# Patient Record
Sex: Female | Born: 1960 | Race: White | Hispanic: No | Marital: Married | Smoking: Current every day smoker
Health system: Southern US, Community
[De-identification: ages and names within clinical notes are randomized; demographics above are authoritative.]

## PROBLEM LIST (undated history)

## (undated) DIAGNOSIS — R079 Chest pain, unspecified: Secondary | ICD-10-CM

## (undated) DIAGNOSIS — I10 Essential (primary) hypertension: Secondary | ICD-10-CM

## (undated) DIAGNOSIS — F429 Obsessive-compulsive disorder, unspecified: Secondary | ICD-10-CM

## (undated) DIAGNOSIS — M810 Age-related osteoporosis without current pathological fracture: Secondary | ICD-10-CM

## (undated) DIAGNOSIS — E7800 Pure hypercholesterolemia, unspecified: Secondary | ICD-10-CM

## (undated) DIAGNOSIS — F411 Generalized anxiety disorder: Secondary | ICD-10-CM

## (undated) DIAGNOSIS — K219 Gastro-esophageal reflux disease without esophagitis: Secondary | ICD-10-CM

## (undated) HISTORY — DX: Gastro-esophageal reflux disease without esophagitis: K21.9

## (undated) HISTORY — DX: Generalized anxiety disorder: F41.1

## (undated) HISTORY — DX: Chest pain, unspecified: R07.9

## (undated) HISTORY — PX: OOPHORECTOMY: SHX86

## (undated) HISTORY — PX: TUBAL LIGATION: SHX77

## (undated) HISTORY — PX: CHOLECYSTECTOMY: SHX55

---

## 2002-10-13 ENCOUNTER — Inpatient Hospital Stay (HOSPITAL_COMMUNITY): Admission: EM | Admit: 2002-10-13 | Discharge: 2002-10-15 | Payer: Self-pay | Admitting: Emergency Medicine

## 2002-10-13 ENCOUNTER — Encounter: Payer: Self-pay | Admitting: Emergency Medicine

## 2002-10-14 ENCOUNTER — Encounter: Payer: Self-pay | Admitting: Family Medicine

## 2002-11-24 ENCOUNTER — Inpatient Hospital Stay (HOSPITAL_COMMUNITY): Admission: RE | Admit: 2002-11-24 | Discharge: 2002-11-27 | Payer: Self-pay | Admitting: General Surgery

## 2004-07-03 ENCOUNTER — Ambulatory Visit (HOSPITAL_COMMUNITY): Admission: RE | Admit: 2004-07-03 | Discharge: 2004-07-03 | Payer: Self-pay | Admitting: Internal Medicine

## 2004-07-05 ENCOUNTER — Ambulatory Visit (HOSPITAL_COMMUNITY): Admission: RE | Admit: 2004-07-05 | Discharge: 2004-07-05 | Payer: Self-pay | Admitting: Internal Medicine

## 2004-07-25 ENCOUNTER — Observation Stay (HOSPITAL_COMMUNITY): Admission: RE | Admit: 2004-07-25 | Discharge: 2004-07-26 | Payer: Self-pay | Admitting: General Surgery

## 2004-09-22 ENCOUNTER — Ambulatory Visit (HOSPITAL_COMMUNITY): Admission: RE | Admit: 2004-09-22 | Discharge: 2004-09-22 | Payer: Self-pay | Admitting: Internal Medicine

## 2005-08-15 ENCOUNTER — Emergency Department (HOSPITAL_COMMUNITY): Admission: EM | Admit: 2005-08-15 | Discharge: 2005-08-15 | Payer: Self-pay | Admitting: Emergency Medicine

## 2005-09-24 ENCOUNTER — Ambulatory Visit: Payer: Self-pay | Admitting: Gastroenterology

## 2005-10-23 ENCOUNTER — Ambulatory Visit: Payer: Self-pay | Admitting: Gastroenterology

## 2006-01-02 ENCOUNTER — Ambulatory Visit (HOSPITAL_COMMUNITY): Admission: RE | Admit: 2006-01-02 | Discharge: 2006-01-02 | Payer: Self-pay | Admitting: Family Medicine

## 2006-01-30 ENCOUNTER — Ambulatory Visit (HOSPITAL_COMMUNITY): Admission: RE | Admit: 2006-01-30 | Discharge: 2006-01-30 | Payer: Self-pay | Admitting: Gastroenterology

## 2006-01-30 ENCOUNTER — Encounter (INDEPENDENT_AMBULATORY_CARE_PROVIDER_SITE_OTHER): Payer: Self-pay | Admitting: Specialist

## 2007-11-05 ENCOUNTER — Ambulatory Visit: Payer: Self-pay | Admitting: Orthopedic Surgery

## 2007-11-05 DIAGNOSIS — M67919 Unspecified disorder of synovium and tendon, unspecified shoulder: Secondary | ICD-10-CM | POA: Insufficient documentation

## 2007-11-05 DIAGNOSIS — M719 Bursopathy, unspecified: Secondary | ICD-10-CM

## 2010-01-18 ENCOUNTER — Ambulatory Visit (HOSPITAL_COMMUNITY): Admission: RE | Admit: 2010-01-18 | Discharge: 2010-01-18 | Payer: Self-pay | Admitting: Family Medicine

## 2010-08-11 NOTE — Op Note (Signed)
[name], [name]              ACCOUNT NO.:  [id number]   MEDICAL RECORD NO.:  [id number]          PATIENT TYPE:  AMB   LOCATION:  DAY                           FACILITY:  [facility]   PHYSICIAN:  Jerolyn Shin C. Katrinka Blazing, M.D.   DATE OF BIRTH:  [birth year]   DATE OF PROCEDURE:  07/25/2004  DATE OF DISCHARGE:                                 OPERATIVE REPORT   PREOPERATIVE DIAGNOSIS:  Chronic cholecystitis.   POSTOPERATIVE DIAGNOSIS:  Chronic cholecystitis.   OPERATION/PROCEDURE:  Laparoscopic cholecystectomy.   SURGEON:  Dirk Dress. Katrinka Blazing, M.D.   DESCRIPTION OF PROCEDURE:  Under general anesthesia the patient's abdomen  was prepped and draped into a sterile field.  A limited midline  supraumbilical incision was made.  A Veress needle was inserted and the  abdomen was insufflated with 2 L of CO2.  Using a Visiport guide, a 10 mm  port was placed.  Laparoscope was placed and gallbladder was visualized.  The patient was positioned in the reverse Trendelenburg.  Under videoscopic  guidance, a 10 mm port and two 5 mm ports were placed in the right subcostal  region.  upper quadrant.  The gallbladder was grasped and positioned.  Cystic duct was dissected from the gallbladder and back to the common bile  duct.  The cystic duct was clipped with five clips close to the gallbladder.  The clips totally traversed the fold of the duct.  The duct was divided.  Next cystic artery had two branches.  Each branch was clipped with three  clips and divided.  The gallbladder was separated from the intrahepatic  space with electrocautery.  It was passed and retrieved intact uneventfully.  Hemostasis in the bed was achieved without difficulty.  The patient  tolerated the procedure well.  Irrigation was carried out and fluid returned  clear.  The patient tolerated the procedure well.  CO2 was  allowed to  escape from the abdomen and the ports were removed.  The incisions were  closed using 0 Dexon with two larger  incisions with staples under the skin.  The patient tolerated the procedure  well.  Dressings were placed.  She was awakened from anesthesia  uneventfully, transferred to a bed and taken to the post anesthesia care  unit for monitoring.      LCS/MEDQ  D:  07/25/2004  T:  07/25/2004  Job:  [id number]   cc:   Madelin Rear. Sherwood Gambler, MD  [address]  [place]  [place] [address]  Fax: [phone]
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
## 2010-08-11 NOTE — Op Note (Signed)
   NAME:  [name], [name]                        ACCOUNT NO.:  [id number]   MEDICAL RECORD NO.:  [id number]                   PATIENT TYPE:  INP   LOCATION:  A312                                 FACILITY:  [facility]   PHYSICIAN:  Dirk Dress. Katrinka Blazing, M.D.                DATE OF BIRTH:  03-14-1961   DATE OF PROCEDURE:  11/24/2002  DATE OF DISCHARGE:                                 OPERATIVE REPORT   PREOPERATIVE DIAGNOSIS:  Right pelvic mass.   POSTOPERATIVE DIAGNOSIS:  Dermoid tumor and mucinous cystadenoma, right  ovary.   PROCEDURE:  Right oophorectomy.   SURGEON:  Dirk Dress. Katrinka Blazing, M.D.   DESCRIPTION OF PROCEDURE:  Under general anesthesia, the patient's abdomen  was prepped and draped in a sterile field.  A Pfannenstiel incision was  made.  Upon entering the abdomen, the left ovary was very small,  foreshrunken, with normal tube.  The uterus was slightly nodular but very  small.  It was a large mass of the right ovary with solid and cystic  components.  The right ovary was dissected.  The vascular pedicle was  dissected.  The pedicle was doubly clamped and divided.  The ovary was then  separated from the tube between two [facility] clamps.  It was sent for  pathology.  The vessels were controlled with double ligatures of 0 Dexon.  Hemostasis was achieved.  Inspection of the upper abdomen was unremarkable.  Frozen section diagnosis took about 20 minutes.  Frozen section diagnosis  was dermoid tumor and mucinous cystadenoma, both benign.  The patient  tolerated the procedure well.  The sponge, needle, instrument, and blade  counts were verified as correct.  The peritoneum and fascia were closed with  0 Biosyn.  The fascia was closed with 0 Prolene.  Subcutaneous tissue closed  with 3-0 Biosyn.  Skin was closed with staples.  The patient tolerated the  procedure well.  Closing sponge count was correct.  A sterile dressing was  placed.  She was awakened from anesthesia uneventfully, transferred  to a  bed, and taken to the postanesthetic care unit for monitoring.                                               Dirk Dress. Katrinka Blazing, M.D.    LCS/MEDQ  D:  11/24/2002  T:  11/24/2002  Job:  [id number]

## 2010-08-11 NOTE — Op Note (Signed)
NAME:  [name], [name]              ACCOUNT NO.:  [id number]   MEDICAL RECORD NO.:  [id number]          PATIENT TYPE:  AMB   LOCATION:  ENDO                         FACILITY:  [facility]   PHYSICIAN:  Anselmo Rod, M.D.  DATE OF BIRTH:  Jun 30, 1960   DATE OF PROCEDURE:  01/30/2006  DATE OF DISCHARGE:                                 OPERATIVE REPORT   PROCEDURE PERFORMED:  Esophagogastroduodenoscopy with multiple cold  biopsies.   ENDOSCOPIST:  Anselmo Rod, M.D.   INSTRUMENT USED:  Olympus video panendoscope.   INDICATIONS FOR PROCEDURE:  The patient is a 50 year old white female with a  history of abnormal weight loss and change in bowel habits undergoing an  esophagogastroduodenoscopy.  Rule out peptic ulcer disease, esophagitis,  etc.  Biopsies from the small bowel obtained to rule out sprue.   PREPROCEDURE PREPARATION:  Informed consent was procured from the patient.  The patient was fasted for four hours prior to the procedure.  The risks and  benefits of the procedure were discussed with her in great detail.   PREPROCEDURE PHYSICAL:  The patient had stable vital signs.  Neck supple,  chest clear to auscultation.  S1, S2 regular.  Abdomen soft with normal  bowel sounds.   DESCRIPTION OF PROCEDURE:  The patient was placed in the left lateral  decubitus position and sedated with 100 mcg of fentanyl and 10 mg of Versed  in slow incremental doses.  Once the patient was adequately sedated and  maintained on low-flow oxygen and continuous cardiac monitoring, the Olympus  video panendoscope was advanced through the mouth piece over the tongue into  the esophagus under direct vision.  The entire esophagus was widely patent  with no evidence of ring, stricture, masses, esophagitis or Barrett's  mucosa.  The scope was then advanced to the stomach.  A small hiatal hernia  was seen on high retroflexion.  Severe diffuse gastritis was noted.  Antral  biopsies were done to rule out  presence of Helicobacter pylori by pathology.  The proximal small bowel appeared normal.  Small bowel biopsies were done to  rule out sprue.  No ulcers, erosions, masses or polyps were identified.  There was no outlet obstruction.  The patient tolerated the procedure well  without immediate complications.   IMPRESSION:  1. Normal appearing esophagus with no evidence of ring, stricture, masses,      esophagus, or Barrett's mucosa.  2. Small hiatal hernia.  3. Diffuse gastritis with no evidence of ulcers, erosions, masses or      polyps.  Antral biopsies done for Helicobacter pylori.'  4. Normal proximal small bowel.  Small bowel biopsies done to rule out      sprue.   RECOMMENDATIONS:  1. Await pathology results.  2. Avoid all nonsteroidals for now.  3. Proceed with a colonoscopy at this time.  Further recommendations will      be made thereafter.      Anselmo Rod, M.D.  Electronically Signed     JNM/MEDQ  D:  01/30/2006  T:  01/31/2006  Job:  [id number]   cc:  Renaye Rakers, M.D.
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
## 2010-08-11 NOTE — H&P (Signed)
[name], [name]              ACCOUNT NO.:  [id number]   MEDICAL RECORD NO.:  [id number]          PATIENT TYPE:  AMB   LOCATION:  DAY                           FACILITY:  [facility]   PHYSICIAN:  Jerolyn Shin C. Katrinka Blazing, M.D.   DATE OF BIRTH:  May 29, 1960   DATE OF ADMISSION:  DATE OF DISCHARGE:  LH                                HISTORY & PHYSICAL   HISTORY OF PRESENT ILLNESS:  A 50 year old female with a one-month history  of epigastric fullness, nausea, poor appetite, and weight loss.  She has  lost weight from 112 pounds to 94 pounds over the past 3 months.  Upper  abdominal ultrasound was unremarkable, but HIDA scan shows no function of  the gallbladder.  The patient is felt to have acalculous cholecystitis, and  will proceed with laparoscopic cholecystectomy.   PAST HISTORY:  1.  She has diagnosed obsessive compulsive disorder.  2.  Gastroesophageal reflux disease.  3.  Chronic low back pain.   MEDICATIONS:  1.  Aciphex 20 mg daily.  2.  Luvox 50 mg daily.  3.  Wellbutrin SR 100 mg daily.  4.  Diazepam 10 mg daily.   ALLERGIES:  SULFA.   SURGERY:  Tubal ligation and right oophorectomy.   PHYSICAL EXAMINATION:  VITAL SIGNS:  Blood pressure 124/82, pulse 72,  respirations 18, weight 94 pounds.  HEENT:  Unremarkable.  NECK:  Supple.  No JVD, bruit, adenopathy, or thyromegaly.  CHEST:  Clear to auscultation.  HEART:  Regular rate and rhythm without murmur, gallop, or rub.  ABDOMEN:  Moderate epigastric and right upper quadrant tenderness.  EXTREMITIES:  No clubbing, cyanosis, or edema.  NEUROLOGIC:  No focal motor, sensory, or cerebellar deficit.   IMPRESSION:  1.  Chronic acalculous cholecystitis.  2.  Gastroesophageal reflux disease.  3.  Chronic low back pain.  4.  Obsessive compulsive disorder.   PLAN:  Laparoscopic cholecystectomy.      LCS/MEDQ  D:  07/24/2004  T:  07/25/2004  Job:  [id number]   cc:   [facility]   [facility]
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
## 2010-08-11 NOTE — Op Note (Signed)
NAME:  [name], [name]              ACCOUNT NO.:  [id number]   MEDICAL RECORD NO.:  [id number]          PATIENT TYPE:  AMB   LOCATION:  ENDO                         FACILITY:  [facility]   PHYSICIAN:  Anselmo Rod, M.D.  DATE OF BIRTH:  13-Apr-1960   DATE OF PROCEDURE:  01/30/2006  DATE OF DISCHARGE:                                 OPERATIVE REPORT   PROCEDURE PERFORMED:  Colonoscopy with multiple cold biopsies.   ENDOSCOPIST:  Anselmo Rod, M.D.   INSTRUMENT USED:  Pediatric adjustable colonoscope.   INDICATIONS FOR PROCEDURE:  A 50 year old white female with a history of  abnormal weight loss, family history of colon cancer, and change in bowel  habits undergoing a screening colonoscopy to rule out colonic polyps,  masses, etc.   PRE-PROCEDURE PREPARATION:  Informed consent was procured from the patient.  The patient fasted for four hours prior to the procedure and prepped with 20  OsmoPrep pills the night of and 12 OsmoPrep pills the morning prior to the  procedure.  Risks and benefits of the procedure, including a 10% missed rate  of cancer and polyps, were discussed with the patient as well.   PRE-PROCEDURE PHYSICAL EXAMINATION:  VITAL SIGNS:  Stable vital signs.  NECK:  Supple.  CHEST:  Clear to auscultation.  HEART:  S1 and S2.  Regular.  ABDOMEN:  Soft with normal bowel sounds.   DESCRIPTION OF PROCEDURE:  The patient was placed in the left lateral  decubitus position and sedated with an additional 25 mcg of fentanyl and 2.5  mg of Versed given intravenously in slow, incremental doses.  Once the  patient was adequately sedated, maintained on low flow oxygen and continuous  cardiac monitoring, the Olympus video colonoscope was advanced from the  rectum to the cecum.  Multiple washes were done.  A small sessile polyp was  biopsied from the rectosigmoid colon.  Random colon biopsies were done to  rule out collagenous colitis.  No masses, polyps, erosions, ulcerations,  or  diverticula were seen in the appendiceal orifice, and the ileocecal valve  was clearly visualized and photographed.  The terminal ileum appeared  healthy and without lesions.  Prominent internal hemorrhoids were seen on  retroflexion.  The patient tolerated the procedure well without immediate  complications.   IMPRESSION:  1. A small sessile polyp, biopsied from rectosigmoid colon.  2. Random colon biopsies done to rule out collagenous colitis.  3. Prominent internal hemorrhoids seen on retroflexion.  4. Normal terminal ileum.   RECOMMENDATIONS:  1. Await pathology results.  2. Avoid all nonsteroidals for now.  3. Outpatient followup in the next two weeks for further recommendations.      A repeat colonoscopy has been recommended in the next 10 years or      earlier if the patient has any abnormal symptoms requiring an earlier      check.      Anselmo Rod, M.D.  Electronically Signed     JNM/MEDQ  D:  01/30/2006  T:  01/31/2006  Job:  [id number]   cc:   Renaye Rakers, M.D.
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
## 2010-08-11 NOTE — Consult Note (Signed)
NAME:  PARTHENA, FERGESON              ACCOUNT NO.:  [id number]   MEDICAL RECORD NO.:  [id number]          PATIENT TYPE:  EMS   LOCATION:  ED                            FACILITY:  [facility]   PHYSICIAN:  Kassie Mends, M.D.      DATE OF BIRTH:  [birth year]   DATE OF CONSULTATION:  DATE OF DISCHARGE:  08/15/2005                                   CONSULTATION   Dear Dr. Sherwood Gambler:   I am seeing Ms. Brahm as a new patient consultation today.  She is seen at  your request for diarrhea, abdominal pain, nausea, and weight loss.   Ms. Grassi is a 50 year old female who weighed 106 pounds in August, 2004.  She was seen in May 2006 for laparoscopic cholecystectomy and weighed 94  pounds.  Prior to her cholecystectomy, she had difficulty with constipation.  Since her cholecystectomy, she has had intermittent change in her bowel  consistency.  She reports they are solid sometimes, other times liquid,  other times soft, and other times mixed.  She does complain of some upper  and lower abdominal pain 1-2 times per week.  The abdominal pain does not  keep her awake.  She says that her symptoms are better since she was last  seen by you.  She was having 6-8 bowel movements a day.  Currently, she is  having 2-3 bowel movements daily.  She denies any blood in her stool.  The  consistency of her stool is like cow manure to just mucus.  Bowel  movements can be triggered by eating, coughing, or smoking a cigarette.  No  particular foods make it worse.  She consumes very little cheese or ice  cream.  Apple juice does not cause her to have this problem.  She does  complain of a decreased appetite.  She complains of belching and burping  when she eats.  She denies any inability to flush her stool, although she  sometimes sees a film on the water.  She does drink well water.  She has no  sick contacts with similar symptoms.  She denies any fever.  She has been  tried on cholestyramine in the past, which has helped with  change in her  stool consistency.  She complains that the cholestyramine caused her to be  constipated, so she stopped it.  She may use it as needed when her number of  bowel movements increases.  Her bowel movement last night was normal and  formed but small in caliber.  This morning she had a soft stool.  She  reports having stool sent for O&P and taking a worm pill.  The medication  did not change her stool consistency.   Past medical history includes obsessive-compulsive disorder and depression.  She reports that she has been told that she has anorexia.  She has  osteoarthritis.  Her past surgical history includes ovarian tumor removal, tubal ligation,  and tonsillectomy.  She is allergic to SULFA.She reports taking Luvox,  diazepam, oxandrolone, cholestyramine, magnesium as needed, Phenergan as  needed, AcipHex as needed, and Tylox as needed.  She has a family history of  colon cancer in her aunt.  She has no first-degree relatives with colon  cancer or colon polyps. She is married.  She denies any significant alcohol  intake ever.  She does smoke a pack a day.  On review of systems, she weighed 125 pounds in high school.  Her review of  systems are as per the HPI, otherwise all other systems are negative.   PHYSICAL EXAMINATION:  VITAL SIGNS:  Weight 92.5 pounds.  Height 5 foot 2-  1/2 inches.  BMI 16.8 (underweight).  Temperature 97.7, blood pressure  100/68.  GENERAL:  She is no apparent distress, alert and oriented x4.  HEENT:  Atraumatic and normocephalic.  Pupils are equal and reactive to  light.  Mouth:  No oral lesions.  She has dentures.  Her posterior pharynx  is without erythema or exudate.  NECK:  Full range of motion and no  lymphadenopathy.  LUNGS:  Clear to auscultation bilaterally.  CARDIOVASCULAR:  Regular rhythm.  No murmur.  Normal S1 and S2.  ABDOMEN:  Bowel sounds are present, soft, nontender, nondistended.  No rebound or  guarding.  No hepatosplenomegaly.  She  has no focal neurological deficits.  EXTREMITIES:  Without clubbing, cyanosis or edema. She has no focal  neurologic deficits.   Ms. Nakatani is a 50 year old female with intermittent change in her stool  consistency.  Her body mass index is 16.8, and her weight has been stable  since May, 2006.  Thank you for allowing me to see Ms. Vandervelden in  consultation.  My list of recommendations follow.   RECOMMENDATIONS:  I recommend a TSH level today.  She should have a random  fecal fat and fecal WBCs.  I will obtain a Giardia antigen.  She should  start calcium carbonate two with meals to reduce bile-salt induced diarrhea.  She is asked to titrate to 1 with meals if she becomes constipated.  If her  bowel movements are increased to 6-8 per day, she is to use cholestyramine.  She is also asked to use Metamucil 1-2 times day to bulk up her stools and  help with the intermittent change in her stool consistency.  The calcium  carbonate can also be added due to her being a thin white female who smokes.  She will follow up in one month and I will assess if her weight remains  stable. If not, I recommend an upper endoscopy and/or a colonoscopy.   Please feel free to contact me at [phone] with additional questions.      Kassie Mends, M.D.  Electronically Signed     SM/MEDQ  D:  09/24/2005  T:  09/24/2005  Job:  [id number]   cc:   Madelin Rear. Sherwood Gambler, MD  Fax: [phone]

## 2010-08-11 NOTE — Consult Note (Signed)
[name]              ACCOUNT NO.:  [id number]   MEDICAL RECORD NO.:  [id number]          PATIENT TYPE:  AMB   LOCATION:  DAY                           FACILITY:  [facility]   PHYSICIAN:  Kassie Mends, M.D.      DATE OF BIRTH:  [birth year]   DATE OF CONSULTATION:  DATE OF DISCHARGE:                                   CONSULTATION   Madelin Rear. Sherwood Gambler, MD  [address]  [place]  [place] [address]   Dear Dr. Sherwood Gambler:   I am seeing [name] as a return patient visit on today.  She was last  seen on July 3 for diarrhea, abdominal pain, nausea, and weight loss.  Ms.  Moncrieffe has a significant psychological history of reportedly manic  depression, obsessive-compulsive disorder, and possibly anorexia nervosa.  She is seen regularly by her therapist, Scarlette Slice, and Dr. Duayne Cal.  I made  recommendations for her symptoms, but there was some confusion, and she was  unable to comply with my recommendations.  She is here today to discuss  managing her symptoms.  She did have a fecal fat which was normal, Giardia  and cryptosporidium negative, and fecal WBCs negative.  She was unable to  find the recommended calcium and fiber supplementation until recently.  She  took one calcium tablet and one FiberChoice tablet, and the following  morning had nausea and vomiting and did not want to restart those pills  until she actually spoke with me.   She has a myriad of complaints and continues to focus on the [facility]  discharge from her rectum.  Her bowel movements vary in their form from soft  to watery to sausage-type.  She denies any blood in her stool or fever.  She  denies being hungry.  She eats zero to one time a day.  Her labs and prior  workup were again reviewed.  Her CT scan in April of 2007 showed no acute  intra-abdominal pathology, hemoglobin 14.2, platelets 153, albumin 4.5, and  normal hepatic function panel.  She had a TSH checked in July of 2007 which  was 0.696.   MEDICATIONS:  1.  Luvox 50 mg, three daily.  2. Diazepam 10 mg every six hours as needed.  3. Cholestyramine as needed.  4. Tylox as needed.   OBJECTIVE:  VITAL SIGNS:  Weight 92.5 pounds (unchanged since last visit one  month ago).  Height 5 feet 2-1/2 inches.  Temperature 97.9, blood pressure  100/60, pulse 60.  GENERAL:  She is in no apparent distress, alert and  oriented times four.  NECK:  Full range of motion and no lymphadenopathy.  LUNGS:  Clear to  auscultation bilaterally.CARDIOVASCULAR:  A regular rhythm.  No  murmur.ABDOMEN:  Bowel sounds are present.  Soft, nontender, nondistended.  No rebound or guarding.   ASSESSMENT:  [name] is a 50 year old female who has multiple somatic  complaints.  Her abdominal pain, bloating, and diarrhea and weight loss are  of unclear etiology.  The differential diagnosis includes the physical  manifestation of psychological disturbance, functional diarrhea and  abdominal  pain, nonulcerative dyspepsia, and a low likelihood of celiac  sprue or microscopic colitis.  Again, she may have a component of post-  cholecystectomy diarrhea.  Thank you for allowing me to see [name] in  consultation.  My recommendations follow.   PLAN:  Obtain upper endoscopy within the next seven days to biopsy her  duodenum to rule out definitively celiac sprue.  She will also have a  flexible sigmoidoscopy with random colonic biopsies to evaluate for  microscopic, collagenous, or lymphocytic colitis.  She is asked to take  calcium with vitamin D, one with meals.  Her calcium tablets include 1.2  grams of calcium carbonate and 800 mg of vitamin D.  She is asked to begin  fiber and take one to two daily.  I will obtain the celiac panel from your  office.  She may add Levsin 0.125 mg one or two 30 minutes before meals and  may repeat that every four hours.  She is asked not to take more than 10 per  day.  She is given all of her instructions in written format.  She may  follow up  in one month.      Kassie Mends, M.D.  Electronically Signed     SM/MEDQ  D:  10/23/2005  T:  10/23/2005  Job:  [id number]   cc:   Madelin Rear. Sherwood Gambler, MD  Fax: [phone]

## 2015-05-31 DIAGNOSIS — F5002 Anorexia nervosa, binge eating/purging type: Secondary | ICD-10-CM | POA: Diagnosis not present

## 2015-05-31 DIAGNOSIS — F419 Anxiety disorder, unspecified: Secondary | ICD-10-CM | POA: Diagnosis not present

## 2015-08-30 DIAGNOSIS — Z681 Body mass index (BMI) 19 or less, adult: Secondary | ICD-10-CM | POA: Diagnosis not present

## 2015-08-30 DIAGNOSIS — F5000 Anorexia nervosa, unspecified: Secondary | ICD-10-CM | POA: Diagnosis not present

## 2015-08-30 DIAGNOSIS — F419 Anxiety disorder, unspecified: Secondary | ICD-10-CM | POA: Diagnosis not present

## 2015-08-30 DIAGNOSIS — E785 Hyperlipidemia, unspecified: Secondary | ICD-10-CM | POA: Diagnosis not present

## 2015-11-29 DIAGNOSIS — E785 Hyperlipidemia, unspecified: Secondary | ICD-10-CM | POA: Diagnosis not present

## 2015-11-29 DIAGNOSIS — F5000 Anorexia nervosa, unspecified: Secondary | ICD-10-CM | POA: Diagnosis not present

## 2015-11-29 DIAGNOSIS — F411 Generalized anxiety disorder: Secondary | ICD-10-CM | POA: Diagnosis not present

## 2015-11-29 DIAGNOSIS — K589 Irritable bowel syndrome without diarrhea: Secondary | ICD-10-CM | POA: Diagnosis not present

## 2016-02-28 DIAGNOSIS — F064 Anxiety disorder due to known physiological condition: Secondary | ICD-10-CM | POA: Diagnosis not present

## 2016-02-28 DIAGNOSIS — F411 Generalized anxiety disorder: Secondary | ICD-10-CM | POA: Diagnosis not present

## 2016-02-28 DIAGNOSIS — R109 Unspecified abdominal pain: Secondary | ICD-10-CM | POA: Diagnosis not present

## 2016-02-28 DIAGNOSIS — K589 Irritable bowel syndrome without diarrhea: Secondary | ICD-10-CM | POA: Diagnosis not present

## 2016-02-28 DIAGNOSIS — F5000 Anorexia nervosa, unspecified: Secondary | ICD-10-CM | POA: Diagnosis not present

## 2016-02-28 DIAGNOSIS — Z23 Encounter for immunization: Secondary | ICD-10-CM | POA: Diagnosis not present

## 2016-02-28 DIAGNOSIS — E785 Hyperlipidemia, unspecified: Secondary | ICD-10-CM | POA: Diagnosis not present

## 2016-05-29 ENCOUNTER — Encounter: Payer: Self-pay | Admitting: Cardiology

## 2016-05-29 DIAGNOSIS — K219 Gastro-esophageal reflux disease without esophagitis: Secondary | ICD-10-CM | POA: Diagnosis not present

## 2016-05-29 DIAGNOSIS — R079 Chest pain, unspecified: Secondary | ICD-10-CM | POA: Diagnosis not present

## 2016-05-29 DIAGNOSIS — F411 Generalized anxiety disorder: Secondary | ICD-10-CM | POA: Diagnosis not present

## 2016-05-29 DIAGNOSIS — F4320 Adjustment disorder, unspecified: Secondary | ICD-10-CM | POA: Diagnosis not present

## 2016-05-29 DIAGNOSIS — Z634 Disappearance and death of family member: Secondary | ICD-10-CM | POA: Diagnosis not present

## 2016-05-29 DIAGNOSIS — R0789 Other chest pain: Secondary | ICD-10-CM | POA: Diagnosis not present

## 2016-05-29 DIAGNOSIS — K210 Gastro-esophageal reflux disease with esophagitis: Secondary | ICD-10-CM | POA: Diagnosis not present

## 2016-06-19 ENCOUNTER — Encounter: Payer: Self-pay | Admitting: *Deleted

## 2016-06-20 ENCOUNTER — Encounter: Payer: Self-pay | Admitting: *Deleted

## 2016-06-20 ENCOUNTER — Encounter: Payer: Self-pay | Admitting: Cardiovascular Disease

## 2016-06-20 ENCOUNTER — Ambulatory Visit (INDEPENDENT_AMBULATORY_CARE_PROVIDER_SITE_OTHER): Payer: PPO | Admitting: Cardiovascular Disease

## 2016-06-20 VITALS — BP 141/87 | HR 74 | Ht 63.0 in | Wt 102.0 lb

## 2016-06-20 DIAGNOSIS — Z720 Tobacco use: Secondary | ICD-10-CM | POA: Diagnosis not present

## 2016-06-20 DIAGNOSIS — I209 Angina pectoris, unspecified: Secondary | ICD-10-CM | POA: Diagnosis not present

## 2016-06-20 DIAGNOSIS — Z8249 Family history of ischemic heart disease and other diseases of the circulatory system: Secondary | ICD-10-CM

## 2016-06-20 DIAGNOSIS — R9431 Abnormal electrocardiogram [ECG] [EKG]: Secondary | ICD-10-CM | POA: Diagnosis not present

## 2016-06-20 DIAGNOSIS — R030 Elevated blood-pressure reading, without diagnosis of hypertension: Secondary | ICD-10-CM | POA: Diagnosis not present

## 2016-06-20 MED ORDER — NITROGLYCERIN 0.4 MG SL SUBL: 0.4000 mg | SUBLINGUAL_TABLET | SUBLINGUAL | 3 refills | Status: DC | PRN

## 2016-06-20 NOTE — Patient Instructions (Signed)
Your physician recommends that you schedule a follow-up appointment in: 1 MONTH WITH DR. Purvis SheffieldKONESWARAN  Your physician has recommended you make the following change in your medication:   START ASPIRIN 81 MG DAILY  TAKE NITROGLYCERIN .04 MG UNDER THE TONGUE AS NEEDED FOR CHEST PAIN   Your physician has requested that you have an echocardiogram. Echocardiography is a painless test that uses sound waves to create images of your heart. It provides your doctor with information about the size and shape of your heart and how well your heart's chambers and valves are working. This procedure takes approximately one hour. There are no restrictions for this procedure.   Your physician has requested that you have en exercise stress myoview. For further information please visit [email or web address]. Please follow instruction sheet, as given.  Nitroglycerin sublingual tablets What is this medicine? NITROGLYCERIN (nye troe GLI ser in) is a type of vasodilator. It relaxes blood vessels, increasing the blood and oxygen supply to your heart. This medicine is used to relieve chest pain caused by angina. It is also used to prevent chest pain before activities like climbing stairs, going outdoors in cold weather, or sexual activity. This medicine may be used for other purposes; ask your health care provider or pharmacist if you have questions. COMMON BRAND NAME(S): Nitroquick, Nitrostat, Nitrotab What should I tell my health care provider before I take this medicine? They need to know if you have any of these conditions: -anemia -head injury, recent stroke, or bleeding in the brain -liver disease -previous heart attack -an unusual or allergic reaction to nitroglycerin, other medicines, foods, dyes, or preservatives -pregnant or trying to get pregnant -breast-feeding How should I use this medicine? Take this medicine by mouth as needed. At the first sign of an angina attack (chest pain or tightness) place one  tablet under your tongue. You can also take this medicine 5 to 10 minutes before an event likely to produce chest pain. Follow the directions on the prescription label. Let the tablet dissolve under the tongue. Do not swallow whole. Replace the dose if you accidentally swallow it. It will help if your mouth is not dry. Saliva around the tablet will help it to dissolve more quickly. Do not eat or drink, smoke or chew tobacco while a tablet is dissolving. If you are not better within 5 minutes after taking ONE dose of nitroglycerin, call 9-1-1 immediately to seek emergency medical care. Do not take more than 3 nitroglycerin tablets over 15 minutes. If you take this medicine often to relieve symptoms of angina, your doctor or health care professional may provide you with different instructions to manage your symptoms. If symptoms do not go away after following these instructions, it is important to call 9-1-1 immediately. Do not take more than 3 nitroglycerin tablets over 15 minutes. Talk to your pediatrician regarding the use of this medicine in children. Special care may be needed. Overdosage: If you think you have taken too much of this medicine contact a poison control center or emergency room at once. NOTE: This medicine is only for you. Do not share this medicine with others. What if I miss a dose? This does not apply. This medicine is only used as needed. What may interact with this medicine? Do not take this medicine with any of the following medications: -certain migraine medicines like ergotamine and dihydroergotamine (DHE) -medicines used to treat erectile dysfunction like sildenafil, tadalafil, and vardenafil -riociguat This medicine may also interact with the following medications: -alteplase -aspirin -  heparin -medicines for high blood pressure -medicines for mental depression -other medicines used to treat angina -phenothiazines like chlorpromazine, mesoridazine, prochlorperazine,  thioridazine This list may not describe all possible interactions. Give your health care provider a list of all the medicines, herbs, non-prescription drugs, or dietary supplements you use. Also tell them if you smoke, drink alcohol, or use illegal drugs. Some items may interact with your medicine. What should I watch for while using this medicine? Tell your doctor or health care professional if you feel your medicine is no longer working. Keep this medicine with you at all times. Sit or lie down when you take your medicine to prevent falling if you feel dizzy or faint after using it. Try to remain calm. This will help you to feel better faster. If you feel dizzy, take several deep breaths and lie down with your feet propped up, or bend forward with your head resting between your knees. You may get drowsy or dizzy. Do not drive, use machinery, or do anything that needs mental alertness until you know how this drug affects you. Do not stand or sit up quickly, especially if you are an older patient. This reduces the risk of dizzy or fainting spells. Alcohol can make you more drowsy and dizzy. Avoid alcoholic drinks. Do not treat yourself for coughs, colds, or pain while you are taking this medicine without asking your doctor or health care professional for advice. Some ingredients may increase your blood pressure. What side effects may I notice from receiving this medicine? Side effects that you should report to your doctor or health care professional as soon as possible: -blurred vision -dry mouth -skin rash -sweating -the feeling of extreme pressure in the head -unusually weak or tired Side effects that usually do not require medical attention (report to your doctor or health care professional if they continue or are bothersome): -flushing of the face or neck -headache -irregular heartbeat, palpitations -nausea, vomiting This list may not describe all possible side effects. Call your doctor for  medical advice about side effects. You may report side effects to FDA at 1-800-FDA-1088. Where should I keep my medicine? Keep out of the reach of children. Store at room temperature between 20 and 25 degrees C (68 and 77 degrees F). Store in Retail buyer. Protect from light and moisture. Keep tightly closed. Throw away any unused medicine after the expiration date. NOTE: This sheet is a summary. It may not cover all possible information. If you have questions about this medicine, talk to your doctor, pharmacist, or health care provider.  2018 Elsevier/Gold Standard (2013-01-08 17:57:36)   Thank you for choosing [facility]!!

## 2016-06-20 NOTE — Progress Notes (Signed)
CARDIOLOGY CONSULT NOTE  Patient ID: [name] MRN: [id number] DOB/AGE: Aug 05, 1960 56 y.o.  Admit date: (Not on file) Primary Physician: Mirna MiresGerald Hill Referring Physician: Loleta ChanceHill  Reason for Consultation: chest pain, abnormal ECG  HPI: The patient is a 56 year old woman with a history of hypertension and tobacco abuse who has been referred by her PCP for the evaluation of chest pain and abnormal ECG.  ECG performed on 05/29/16 which I personally interpreted demonstrated sinus rhythm with artifact and T-wave inversions in aVL and leads V1 and V2.  I reviewed office records from her PCP. She has been grieving over the death of her father on 04/29/16. Prior to this she had been experiencing a burning sensation in her chest and in her stomach for about one month, worse on the day of the funeral. It lasted for roughly 2-3 hours that day. She was given 2 aspirins and a Xanax by her friends without relief. She did not go to the emergency room for evaluation.  She tells me she was standing at the grave site when it occurred. It was retrosternal in location and described as sharp. There was no radiation to the back, jaw, or arm. There was no associated shortness of breath, nausea, vomiting, lightheadedness, or dizziness. Later that day she took 2 Tums and Pepto Bismol without relief.  Since that time she has been having intermittent episodes of chest pain and tightness lasting anywhere from 30-60 minutes in the same region of her chest.  Labs performed on 05/29/16: Sodium 140, potassium 4.2, BUN 24, creatinine 0.69.  Social history: She has been smoking 1.5 packs of cigarettes daily for the past 20 years.  Family history: Her mother had her first MI at age 58 and ultimately required CABG.  Allergies  Allergen Reactions  . Sulfonamide Derivatives     Current Outpatient Prescriptions  Medication Sig Dispense Refill  . diazepam (VALIUM) 2 MG tablet Take 2 mg by mouth 2 (two) times  daily.    . ranitidine (ZANTAC) 150 MG tablet      No current facility-administered medications for this visit.     Past Medical History:  Diagnosis Date  . Chest pain   . Gastro-esophageal reflux disease without esophagitis   . Generalized anxiety disorder     Past Surgical History:  Procedure Laterality Date  . CHOLECYSTECTOMY    . OOPHORECTOMY    . TUBAL LIGATION      Social History   Social History  . Marital status: Married    Spouse name: N/A  . Number of children: N/A  . Years of education: N/A   Occupational History  . Not on file.   Social History Main Topics  . Smoking status: Current Every Day Smoker    Packs/day: 1.50    Start date: 06/20/1976  . Smokeless tobacco: Never Used  . Alcohol use Not on file  . Drug use: Unknown  . Sexual activity: Not on file   Other Topics Concern  . Not on file   Social History Narrative  . No narrative on file       Prior to Admission medications   Medication Sig Start Date End Date Taking? Authorizing Provider  diazepam (VALIUM) 2 MG tablet Take 2 mg by mouth 2 (two) times daily.   Yes Historical Provider, MD  ranitidine (ZANTAC) 150 MG tablet  05/29/16  Yes Historical Provider, MD     Review of systems complete and found to be negative  unless listed above in HPI     Physical exam Blood pressure (!) 141/87, pulse 74, height 5\' 3"  (1.6 m), weight 102 lb (46.3 kg), SpO2 99 %. General: NAD Neck: No JVD, no thyromegaly or thyroid nodule.  Lungs: Clear to auscultation bilaterally with normal respiratory effort. CV: Nondisplaced PMI. Regular rate and rhythm, normal S1/S2, no S3/S4, no murmur.  No peripheral edema.  No carotid bruit.  Normal pedal pulses.  Abdomen: Soft, nontender, no hepatosplenomegaly, no distention.  Skin: Intact without lesions or rashes.  Neurologic: Alert and oriented x 3.  Psych: Normal affect. Extremities: No clubbing or cyanosis.  HEENT: Normal.   ECG: Most recent ECG  reviewed.    Labs:  No results found for: WBC, HGB, HCT, MCV, PLT No results for input(s): NA, K, CL, CO2, BUN, CREATININE, CALCIUM, PROT, BILITOT, ALKPHOS, ALT, AST, GLUCOSE in the last 168 hours.  Invalid input(s): LABALBU No results found for: CKTOTAL, CKMB, CKMBINDEX, TROPONINI No results found for: CHOL No results found for: HDL No results found for: LDLCALC No results found for: TRIG No results found for: CHOLHDL No results found for: LDLDIRECT       Studies: No results found.  ASSESSMENT AND PLAN:  1. Angina: I am concerned that her symptoms represent ischemic heart disease. Her ECG is abnormal with precordial and high lateral T-wave inversion suggestive of possible LAD and diagonal disease. I will start aspirin 81 mg daily. I will prescribe nitroglycerin. I will proceed with a nuclear myocardial perfusion imaging study to evaluate for ischemic heart disease (exercise Myoview). I will order a 2-D echocardiogram with Doppler to evaluate cardiac structure, function, and regional wall motion.  2. Elevated BP: I will monitor this to see if antihypertensive therapy is warranted.  3. Tobacco abuse   Dispo: fu 1 month    Signed: Prentice Docker, M.D., F.A.C.C.  06/20/2016, 10:59 AM

## 2016-06-28 ENCOUNTER — Inpatient Hospital Stay (HOSPITAL_COMMUNITY): Admission: RE | Admit: 2016-06-28 | Payer: Self-pay | Source: Ambulatory Visit

## 2016-06-28 ENCOUNTER — Ambulatory Visit (HOSPITAL_BASED_OUTPATIENT_CLINIC_OR_DEPARTMENT_OTHER)
Admission: RE | Admit: 2016-06-28 | Discharge: 2016-06-28 | Disposition: A | Discharge status: Home / Self Care | Payer: PPO | Source: Ambulatory Visit | Attending: Cardiovascular Disease | Admitting: Cardiovascular Disease

## 2016-06-28 ENCOUNTER — Encounter (HOSPITAL_COMMUNITY)
Admission: RE | Admit: 2016-06-28 | Discharge: 2016-06-28 | Disposition: A | Discharge status: Home / Self Care | Payer: PPO | Source: Ambulatory Visit | Attending: Cardiovascular Disease | Admitting: Cardiovascular Disease

## 2016-06-28 ENCOUNTER — Encounter (HOSPITAL_COMMUNITY): Payer: Self-pay

## 2016-06-28 DIAGNOSIS — I209 Angina pectoris, unspecified: Secondary | ICD-10-CM | POA: Diagnosis not present

## 2016-06-28 LAB — ECHOCARDIOGRAM COMPLETE
CHL CUP DOP CALC LVOT VTI: 18.4 cm
CHL CUP STROKE VOLUME: 27 mL
E/e' ratio: 6.44
EWDT: 232 ms
FS: 26 % — AB (ref 28–44)
IV/PV OW: 1.17
LA diam end sys: 20 mm
LA diam index: 1.4 cm/m2
LA vol A4C: 18 ml
LASIZE: 20 mm
LAVOL: 21.6 mL
LAVOLIN: 15.1 mL/m2
LV E/e' medial: 6.44
LV E/e'average: 6.44
LV TDI E'MEDIAL: 8.92
LV sys vol: 14 mL (ref 14–42)
LVDIAVOL: 41 mL — AB (ref 46–106)
LVDIAVOLIN: 29 mL/m2
LVELAT: 9.46 cm/s
LVOT SV: 42 mL
LVOT area: 2.27 cm2
LVOT peak grad rest: 3 mmHg
LVOTD: 17 mm
LVOTPV: 85.1 cm/s
LVSYSVOLIN: 10 mL/m2
Lateral S' vel: 10.6 cm/s
MV Dec: 232
MV pk A vel: 54.8 m/s
MV pk E vel: 60.9 m/s
PW: 8.61 mm — AB (ref 0.6–1.1)
RV TAPSE: 16.9 mm
RV sys press: 24 mmHg
Reg peak vel: 230 cm/s
Simpson's disk: 66
TDI e' lateral: 9.46
TR max vel: 230 cm/s

## 2016-06-28 LAB — NM MYOCAR MULTI W/SPECT W/WALL MOTION / EF
CHL CUP NUCLEAR SRS: 0
CHL CUP NUCLEAR SSS: 4
CSEPED: 7 min
CSEPEW: 10.1 METS
Exercise duration (sec): 19 s
LHR: 0
LV dias vol: 56 mL (ref 46–106)
LVSYSVOL: 22 mL
MPHR: 163 {beats}/min
NUC STRESS TID: 1.04
Peak HR: 134 {beats}/min
Percent HR: 82 %
RPE: 15
Rest HR: 60 {beats}/min
SDS: 4

## 2016-06-28 MED ORDER — SODIUM CHLORIDE 0.9% FLUSH
INTRAVENOUS | Status: AC
  Administered 2016-06-28: 10 mL via INTRAVENOUS
  Filled 2016-06-28: qty 10

## 2016-06-28 MED ORDER — TECHNETIUM TC 99M TETROFOSMIN IV KIT
30.0000 | PACK | Freq: Once | INTRAVENOUS | Status: AC | PRN
  Administered 2016-06-28: 29.5 via INTRAVENOUS

## 2016-06-28 MED ORDER — REGADENOSON 0.4 MG/5ML IV SOLN
INTRAVENOUS | Status: AC
  Administered 2016-06-28: 0.4 mg via INTRAVENOUS
  Filled 2016-06-28: qty 5

## 2016-06-28 MED ORDER — TECHNETIUM TC 99M TETROFOSMIN IV KIT
10.0000 | PACK | Freq: Once | INTRAVENOUS | Status: AC | PRN
  Administered 2016-06-28: 11 via INTRAVENOUS

## 2016-06-28 NOTE — Progress Notes (Signed)
*  PRELIMINARY RESULTS* Echocardiogram 2D Echocardiogram has been performed.  Stacey Drain 06/28/2016, 1:12 PM

## 2016-06-29 ENCOUNTER — Telehealth: Payer: Self-pay | Admitting: *Deleted

## 2016-06-29 NOTE — Telephone Encounter (Signed)
STRESS TEST -   Notes recorded by Laqueta Linden, MD on 06/28/2016 at 3:07 PM EDT Low risk, no nuclear imaging evidence of blockages.

## 2016-06-29 NOTE — Telephone Encounter (Signed)
ECHO -   Notes recorded by Laqueta Linden, MD on 06/28/2016 at 3:08 PM EDT Normal cardiac function.

## 2016-06-29 NOTE — Telephone Encounter (Signed)
Notes recorded by Lesle Chris, LPN on 11/24/4780 at 3:29 PM EDT Patient notified and verbalized understanding. Copy pmd. Follow up scheduled for 07/19/2016 with Dr. Purvis Sheffield. ------

## 2016-07-19 ENCOUNTER — Encounter: Payer: Self-pay | Admitting: Cardiovascular Disease

## 2016-07-19 ENCOUNTER — Ambulatory Visit (INDEPENDENT_AMBULATORY_CARE_PROVIDER_SITE_OTHER): Payer: PPO | Admitting: Cardiovascular Disease

## 2016-07-19 VITALS — BP 118/90 | HR 84 | Ht 63.0 in | Wt 101.0 lb

## 2016-07-19 DIAGNOSIS — I209 Angina pectoris, unspecified: Secondary | ICD-10-CM

## 2016-07-19 DIAGNOSIS — R030 Elevated blood-pressure reading, without diagnosis of hypertension: Secondary | ICD-10-CM | POA: Diagnosis not present

## 2016-07-19 DIAGNOSIS — R9431 Abnormal electrocardiogram [ECG] [EKG]: Secondary | ICD-10-CM | POA: Diagnosis not present

## 2016-07-19 DIAGNOSIS — F429 Obsessive-compulsive disorder, unspecified: Secondary | ICD-10-CM

## 2016-07-19 DIAGNOSIS — Z716 Tobacco abuse counseling: Secondary | ICD-10-CM

## 2016-07-19 DIAGNOSIS — Z8249 Family history of ischemic heart disease and other diseases of the circulatory system: Secondary | ICD-10-CM | POA: Diagnosis not present

## 2016-07-19 NOTE — Progress Notes (Signed)
SUBJECTIVE: The patient returns for follow-up after undergoing cardiovascular testing performed for the evaluation of chest pain.  Nuclear myocardial perfusion showed no evidence of ischemia or infarction. Horizontal ST segment depression of 1 mm was noted in V4, V5, and V6. It was deemed a low risk study.  Echocardiogram showed normal left ventricular systolic and diastolic function, LVEF 55%.  She has had some mild chest pain recurrence but nothing as significant as before. She said she has a very stressful life. Her husband drinks alcohol about twice per week and gets drunk which makes her feel anxious. She takes Valium. She has had some left sided neck pains lasting seconds. She has not had to use nitroglycerin.  She tells me that she wants to quit smoking cigarettes. She asked about Nicotrol and vaping.  She also told me about her obsessive-compulsive disorder. She used to see mental health in the past for it but had some side effects from one of the medications.   Review of Systems: As per "subjective", otherwise negative.  Allergies  Allergen Reactions  . Sulfonamide Derivatives     Current Outpatient Prescriptions  Medication Sig Dispense Refill  . aspirin EC 81 MG tablet Take 81 mg by mouth daily.    . diazepam (VALIUM) 2 MG tablet Take 2 mg by mouth 2 (two) times daily.    . nitroGLYCERIN (NITROSTAT) 0.4 MG SL tablet Place 1 tablet (0.4 mg total) under the tongue every 5 (five) minutes as needed for chest pain. 25 tablet 3  . ranitidine (ZANTAC) 150 MG tablet Take 150 mg by mouth 2 (two) times daily.      No current facility-administered medications for this visit.     Past Medical History:  Diagnosis Date  . Chest pain   . Gastro-esophageal reflux disease without esophagitis   . Generalized anxiety disorder     Past Surgical History:  Procedure Laterality Date  . CHOLECYSTECTOMY    . OOPHORECTOMY    . TUBAL LIGATION      Social History   Social  History  . Marital status: Married    Spouse name: N/A  . Number of children: N/A  . Years of education: N/A   Occupational History  . Not on file.   Social History Main Topics  . Smoking status: Current Every Day Smoker    Packs/day: 1.50    Start date: 06/20/1976  . Smokeless tobacco: Never Used  . Alcohol use Not on file  . Drug use: Unknown  . Sexual activity: Not on file   Other Topics Concern  . Not on file   Social History Narrative  . No narrative on file     Vitals:   07/19/16 1309  BP: 118/90  Pulse: 84  SpO2: 98%  Weight: 101 lb (45.8 kg)  Height:  (1.6 m)    Wt Readings from Last 3 Encounters:  07/19/16 101 lb (45.8 kg)  06/20/16 102 lb (46.3 kg)  05/29/16 101 lb (45.8 kg)     PHYSICAL EXAM General: NAD HEENT: Normal. Neck: No JVD, no thyromegaly. Lungs: Clear to auscultation bilaterally with normal respiratory effort. CV: Nondisplaced PMI.  Regular rate and rhythm, normal S1/S2, no S3/S4, no murmur. No pretibial or periankle edema.  No carotid bruit.   Abdomen: Soft, nontender, no distention.  Neurologic: Alert and oriented.  Psych: Normal affect. Skin: Normal. Musculoskeletal: No gross deformities.    ECG: Most recent ECG reviewed.   Labs: No results found  for: K, BUN, CREATININE, ALT, TSH, HGB   Lipids: No results found for: LDLCALC, LDLDIRECT, CHOL, TRIG, HDL     ASSESSMENT AND PLAN: 1. Angina with abnormal ECG: Symptoms have significantly improved. Continue aspirin 81 mg. She can use nitroglycerin as needed. Given that there was no ischemia or scar seen with nuclear stress testing, no further testing is indicated at this time.  2. Elevated BP: Mildly elevated DBP. I will monitor and consider adding amlodipine. I have asker her to monitor this as well.  3. Tobacco abuse: Cessation counseling provided (3 minutes). She will consider Chantix. She says she wants to quit.  4. OCD: Encouraged to seek behavior health  evaluation.     Disposition: Follow up 6 months  Prentice Docker, M.D., F.A.C.C.

## 2016-07-19 NOTE — Patient Instructions (Signed)

## 2016-07-24 DIAGNOSIS — Z1231 Encounter for screening mammogram for malignant neoplasm of breast: Secondary | ICD-10-CM | POA: Diagnosis not present

## 2016-08-28 DIAGNOSIS — K219 Gastro-esophageal reflux disease without esophagitis: Secondary | ICD-10-CM | POA: Diagnosis not present

## 2016-08-28 DIAGNOSIS — F411 Generalized anxiety disorder: Secondary | ICD-10-CM | POA: Diagnosis not present

## 2016-08-28 DIAGNOSIS — E785 Hyperlipidemia, unspecified: Secondary | ICD-10-CM | POA: Diagnosis not present

## 2016-08-28 DIAGNOSIS — K210 Gastro-esophageal reflux disease with esophagitis: Secondary | ICD-10-CM | POA: Diagnosis not present

## 2016-08-28 DIAGNOSIS — R109 Unspecified abdominal pain: Secondary | ICD-10-CM | POA: Diagnosis not present

## 2016-09-20 DIAGNOSIS — H2513 Age-related nuclear cataract, bilateral: Secondary | ICD-10-CM | POA: Diagnosis not present

## 2016-09-20 DIAGNOSIS — H04123 Dry eye syndrome of bilateral lacrimal glands: Secondary | ICD-10-CM | POA: Diagnosis not present

## 2016-11-19 ENCOUNTER — Ambulatory Visit (INDEPENDENT_AMBULATORY_CARE_PROVIDER_SITE_OTHER): Payer: PPO | Admitting: Cardiovascular Disease

## 2016-11-19 ENCOUNTER — Encounter: Payer: Self-pay | Admitting: Cardiovascular Disease

## 2016-11-19 VITALS — BP 128/80 | HR 78 | Ht 63.0 in | Wt 97.2 lb

## 2016-11-19 DIAGNOSIS — Z8249 Family history of ischemic heart disease and other diseases of the circulatory system: Secondary | ICD-10-CM

## 2016-11-19 DIAGNOSIS — I209 Angina pectoris, unspecified: Secondary | ICD-10-CM | POA: Diagnosis not present

## 2016-11-19 DIAGNOSIS — R42 Dizziness and giddiness: Secondary | ICD-10-CM | POA: Diagnosis not present

## 2016-11-19 DIAGNOSIS — Z716 Tobacco abuse counseling: Secondary | ICD-10-CM | POA: Diagnosis not present

## 2016-11-19 DIAGNOSIS — R030 Elevated blood-pressure reading, without diagnosis of hypertension: Secondary | ICD-10-CM

## 2016-11-19 NOTE — Progress Notes (Signed)
SUBJECTIVE: The patient presents for follow-up of angina. She denies chest pain. She has had 3 episodes of lightheadedness but denies presyncope and syncope. She also denies dizziness, slurred speech, and extremity weakness. She continues to smoke at least one half packs of cigarettes daily.   Review of Systems: As per "subjective", otherwise negative.  Allergies  Allergen Reactions  . Sulfonamide Derivatives     Current Outpatient Prescriptions  Medication Sig Dispense Refill  . aspirin EC 81 MG tablet Take 81 mg by mouth daily.    . diazepam (VALIUM) 2 MG tablet Take 2 mg by mouth 2 (two) times daily.    . nitroGLYCERIN (NITROSTAT) 0.4 MG SL tablet Place 1 tablet (0.4 mg total) under the tongue every 5 (five) minutes as needed for chest pain. 25 tablet 3  . ranitidine (ZANTAC) 150 MG tablet Take 150 mg by mouth 2 (two) times daily.      No current facility-administered medications for this visit.     Past Medical History:  Diagnosis Date  . Chest pain   . Gastro-esophageal reflux disease without esophagitis   . Generalized anxiety disorder     Past Surgical History:  Procedure Laterality Date  . CHOLECYSTECTOMY    . OOPHORECTOMY    . TUBAL LIGATION      Social History   Social History  . Marital status: Married    Spouse name: N/A  . Number of children: N/A  . Years of education: N/A   Occupational History  . Not on file.   Social History Main Topics  . Smoking status: Current Every Day Smoker    Packs/day: 1.50    Start date: 06/20/1976  . Smokeless tobacco: Never Used  . Alcohol use Not on file  . Drug use: Unknown  . Sexual activity: Not on file   Other Topics Concern  . Not on file   Social History Narrative  . No narrative on file     Vitals:   11/19/16 1320  BP: 128/80  Pulse: 78  SpO2: 98%  Weight: 97 lb 3.2 oz (44.1 kg)  Height: 5\' 3"  (1.6 m)    Wt Readings from Last 3 Encounters:  11/19/16 97 lb 3.2 oz (44.1 kg)  07/19/16  101 lb (45.8 kg)  06/20/16 102 lb (46.3 kg)     PHYSICAL EXAM General: NAD HEENT: Normal. Neck: No JVD, no thyromegaly. Lungs: Clear to auscultation bilaterally with normal respiratory effort. CV: Nondisplaced PMI.  Regular rate and rhythm, normal S1/S2, no S3/S4, no murmur. No pretibial or periankle edema.  No carotid bruit.   Abdomen: Soft, nontender, no distention.  Neurologic: Alert and oriented.  Psych: Normal affect. Skin: Normal. Musculoskeletal: No gross deformities.    ECG: Most recent ECG reviewed.   Labs: No results found for: K, BUN, CREATININE, ALT, TSH, HGB   Lipids: No results found for: LDLCALC, LDLDIRECT, CHOL, TRIG, HDL     ASSESSMENT AND PLAN:  1. Angina with abnormal UVO:[id number] stable. Continue aspirin 81 mg. She has been instructed to use nitroglycerin as needed. Given that there was no ischemia or scar seen with nuclear stress testing, no further testing is indicated at this time.  2. Elevated GL:OVFIEPPIRJ today. Will monitor.  3. Tobacco abuse: Cessation counseling again provided. She says she wants to quit.  4. Lightheadedness: No signs of CVA or symptoms consistent with benign paroxysmal positional vertigo. Continue to monitor. Tobacco cessation encouraged.   Disposition: Follow up 1 yr.  Kate Sable, M.D., F.A.C.C.

## 2016-11-19 NOTE — Patient Instructions (Signed)

## 2016-11-27 DIAGNOSIS — E785 Hyperlipidemia, unspecified: Secondary | ICD-10-CM | POA: Diagnosis not present

## 2016-11-27 DIAGNOSIS — I10 Essential (primary) hypertension: Secondary | ICD-10-CM | POA: Diagnosis not present

## 2017-02-26 DIAGNOSIS — Z720 Tobacco use: Secondary | ICD-10-CM | POA: Diagnosis not present

## 2017-02-26 DIAGNOSIS — I10 Essential (primary) hypertension: Secondary | ICD-10-CM | POA: Diagnosis not present

## 2017-02-26 DIAGNOSIS — M419 Scoliosis, unspecified: Secondary | ICD-10-CM | POA: Diagnosis not present

## 2017-02-26 DIAGNOSIS — E785 Hyperlipidemia, unspecified: Secondary | ICD-10-CM | POA: Diagnosis not present

## 2017-02-27 ENCOUNTER — Other Ambulatory Visit (HOSPITAL_COMMUNITY): Payer: Self-pay | Admitting: Family Medicine

## 2017-02-27 DIAGNOSIS — Z780 Asymptomatic menopausal state: Secondary | ICD-10-CM

## 2017-03-12 ENCOUNTER — Ambulatory Visit (HOSPITAL_COMMUNITY)
Admission: RE | Admit: 2017-03-12 | Discharge: 2017-03-12 | Disposition: A | Discharge status: Home / Self Care | Payer: PPO | Source: Ambulatory Visit | Attending: Family Medicine | Admitting: Family Medicine

## 2017-03-12 ENCOUNTER — Other Ambulatory Visit (HOSPITAL_COMMUNITY): Payer: PPO

## 2017-03-12 DIAGNOSIS — M810 Age-related osteoporosis without current pathological fracture: Secondary | ICD-10-CM | POA: Insufficient documentation

## 2017-03-12 DIAGNOSIS — Z780 Asymptomatic menopausal state: Secondary | ICD-10-CM | POA: Insufficient documentation

## 2017-03-12 DIAGNOSIS — F17200 Nicotine dependence, unspecified, uncomplicated: Secondary | ICD-10-CM | POA: Diagnosis not present

## 2017-05-28 DIAGNOSIS — Z726 Gambling and betting: Secondary | ICD-10-CM | POA: Diagnosis not present

## 2017-05-28 DIAGNOSIS — I10 Essential (primary) hypertension: Secondary | ICD-10-CM | POA: Diagnosis not present

## 2017-05-28 DIAGNOSIS — M4135 Thoracogenic scoliosis, thoracolumbar region: Secondary | ICD-10-CM | POA: Diagnosis not present

## 2017-05-28 DIAGNOSIS — Z720 Tobacco use: Secondary | ICD-10-CM | POA: Diagnosis not present

## 2017-05-28 DIAGNOSIS — E785 Hyperlipidemia, unspecified: Secondary | ICD-10-CM | POA: Diagnosis not present

## 2017-08-27 DIAGNOSIS — Z720 Tobacco use: Secondary | ICD-10-CM | POA: Diagnosis not present

## 2017-08-27 DIAGNOSIS — I10 Essential (primary) hypertension: Secondary | ICD-10-CM | POA: Diagnosis not present

## 2017-08-27 DIAGNOSIS — F411 Generalized anxiety disorder: Secondary | ICD-10-CM | POA: Diagnosis not present

## 2017-08-27 DIAGNOSIS — E785 Hyperlipidemia, unspecified: Secondary | ICD-10-CM | POA: Diagnosis not present

## 2017-09-06 ENCOUNTER — Encounter (HOSPITAL_COMMUNITY): Payer: Self-pay | Admitting: Emergency Medicine

## 2017-09-06 ENCOUNTER — Other Ambulatory Visit: Payer: Self-pay

## 2017-09-06 ENCOUNTER — Emergency Department (HOSPITAL_COMMUNITY)
Admission: EM | Admit: 2017-09-06 | Discharge: 2017-09-06 | Disposition: A | Discharge status: Home / Self Care | Payer: PPO | Attending: Emergency Medicine | Admitting: Emergency Medicine

## 2017-09-06 ENCOUNTER — Emergency Department (HOSPITAL_COMMUNITY): Payer: PPO

## 2017-09-06 DIAGNOSIS — S61411A Laceration without foreign body of right hand, initial encounter: Secondary | ICD-10-CM | POA: Insufficient documentation

## 2017-09-06 DIAGNOSIS — I10 Essential (primary) hypertension: Secondary | ICD-10-CM | POA: Insufficient documentation

## 2017-09-06 DIAGNOSIS — M7989 Other specified soft tissue disorders: Secondary | ICD-10-CM | POA: Diagnosis not present

## 2017-09-06 DIAGNOSIS — F17210 Nicotine dependence, cigarettes, uncomplicated: Secondary | ICD-10-CM | POA: Insufficient documentation

## 2017-09-06 DIAGNOSIS — Y999 Unspecified external cause status: Secondary | ICD-10-CM | POA: Insufficient documentation

## 2017-09-06 DIAGNOSIS — Y939 Activity, unspecified: Secondary | ICD-10-CM | POA: Insufficient documentation

## 2017-09-06 DIAGNOSIS — L089 Local infection of the skin and subcutaneous tissue, unspecified: Secondary | ICD-10-CM | POA: Diagnosis not present

## 2017-09-06 DIAGNOSIS — W5503XA Scratched by cat, initial encounter: Secondary | ICD-10-CM | POA: Diagnosis not present

## 2017-09-06 DIAGNOSIS — S60511A Abrasion of right hand, initial encounter: Secondary | ICD-10-CM

## 2017-09-06 DIAGNOSIS — Z7982 Long term (current) use of aspirin: Secondary | ICD-10-CM | POA: Insufficient documentation

## 2017-09-06 DIAGNOSIS — Z23 Encounter for immunization: Secondary | ICD-10-CM | POA: Diagnosis not present

## 2017-09-06 DIAGNOSIS — R509 Fever, unspecified: Secondary | ICD-10-CM | POA: Diagnosis not present

## 2017-09-06 DIAGNOSIS — Y929 Unspecified place or not applicable: Secondary | ICD-10-CM | POA: Insufficient documentation

## 2017-09-06 HISTORY — DX: Age-related osteoporosis without current pathological fracture: M81.0

## 2017-09-06 HISTORY — DX: Essential (primary) hypertension: I10

## 2017-09-06 HISTORY — DX: Pure hypercholesterolemia, unspecified: E78.00

## 2017-09-06 HISTORY — DX: Obsessive-compulsive disorder, unspecified: F42.9

## 2017-09-06 MED ORDER — AMOXICILLIN-POT CLAVULANATE 875-125 MG PO TABS: 1.0000 | ORAL_TABLET | Freq: Two times a day (BID) | ORAL | 0 refills | Status: DC

## 2017-09-06 MED ORDER — TETANUS-DIPHTH-ACELL PERTUSSIS 5-2.5-18.5 LF-MCG/0.5 IM SUSP
0.5000 mL | Freq: Once | INTRAMUSCULAR | Status: AC
  Administered 2017-09-06: 0.5 mL via INTRAMUSCULAR
  Filled 2017-09-06: qty 0.5

## 2017-09-06 NOTE — ED Notes (Signed)
Patient to radiology.

## 2017-09-06 NOTE — ED Provider Notes (Signed)
[facility] EMERGENCY DEPARTMENT Provider Note   CSN: [id number] Arrival date & time: 09/06/17  1146     History   Chief Complaint Chief Complaint  Patient presents with  . Hand Pain    HPI [name] is a 57 y.o. female with a hx of tobacco abuse, GERD, generalized anxiety disorder, hyperlipidemia, HTN, and OCD who presents to the ED with complaints of R 3rd knuckle pain/swelling/redness x 5 days. Patient states that about 5 days ago one of her cats scratched the palm of her R hand, same day she started to notice pain to her 3rd R knuckle. Subsequently developed swelling/erythema. States sxs have progressively worsened. Last night started to have some subjective fever/chills as well as soreness in the R axillary region. Rates pain at present a 9/10 in severity, worse with movement, no alleviating factors, no meds PTA. Patient denies nausea, vomiting, numbness, or weakness. Has not noted redness spreading up arm. States her cats have been rabies vaccinated.   HPI  Past Medical History:  Diagnosis Date  . Chest pain   . Gastro-esophageal reflux disease without esophagitis   . Generalized anxiety disorder   . High cholesterol   . Hypertension   . OCD (obsessive compulsive disorder)   . Osteoporosis     Patient Active Problem List   Diagnosis Date Noted  . BURSITIS, RIGHT SHOULDER 11/05/2007    Past Surgical History:  Procedure Laterality Date  . CHOLECYSTECTOMY    . OOPHORECTOMY    . TUBAL LIGATION       OB History   None      Home Medications    Prior to Admission medications   Medication Sig Start Date End Date Taking? Authorizing Provider  aspirin EC 81 MG tablet Take 81 mg by mouth daily.    [provider]  diazepam (VALIUM) 2 MG tablet Take 2 mg by mouth 2 (two) times daily.    [provider]  nitroGLYCERIN (NITROSTAT) 0.4 MG SL tablet Place 1 tablet (0.4 mg total) under the tongue every 5 (five) minutes as needed for chest pain.  06/20/16   Laqueta Linden, MD  ranitidine (ZANTAC) 150 MG tablet Take 150 mg by mouth 2 (two) times daily.  05/29/16   [provider]    Family History Family History  Problem Relation Age of Onset  . Heart attack Mother   . Heart disease Mother   . Hypertension Mother   . Diabetes Mother   . Colon cancer Father   . Kidney cancer Father   . Aneurysm Father   . Hypertension Father   . Heart attack Maternal Aunt   . Heart disease Maternal Aunt   . Hypertension Maternal Aunt     Social History Social History   Tobacco Use  . Smoking status: Current Every Day Smoker    Packs/day: 1.50    Start date: 06/20/1976  . Smokeless tobacco: Never Used  Substance Use Topics  . Alcohol use: Not Currently    Frequency: Never  . Drug use: Never     Allergies   Sulfonamide derivatives   Review of Systems Review of Systems  Constitutional: Positive for chills and fever (subjective).  Gastrointestinal: Negative for nausea and vomiting.  Musculoskeletal:       Positive for pain/swelling to R 3rd knuckle. Positive for R axillary discomfort.   Skin: Positive for color change (R 3rd knuckle red).     Physical Exam Updated Vital Signs BP (!) 129/93 (BP  Location: Left Arm) Comment: initial 137/100  Pulse (!) 107   Temp 99.7 F (37.6 C) (Oral)   Resp 18   Ht 5\' 3"  (1.6 m)   Wt 44 kg (97 lb)   SpO2 100%   BMI 17.18 kg/m   Physical Exam  Constitutional: She appears well-developed and well-nourished. No distress.  HENT:  Head: Normocephalic and atraumatic.  Eyes: Conjunctivae are normal. Right eye exhibits no discharge. Left eye exhibits no discharge.  Cardiovascular: Normal rate and regular rhythm.  No murmur heard. 2+ symmetric radial pulses.   Pulmonary/Chest: Effort normal and breath sounds normal.  Abdominal: She exhibits no distension. There is no tenderness.  Musculoskeletal:  Upper extremities: Patient's right third MCP joint is erythematous and swollen,  this area is tender to palpation.  Erythema does not spread in a lymphangitic pattern.  There is no obvious deformity.  No palpable fluctuance.  She also has a small abrasion to medial palm. She has normal range of motion to bilateral shoulders, elbows, wrists, and all digits.  She has a palpable right axillary lymph node approximately 1 cm in diameter, tender palpation.  Other than the R third MCP and her R axillary lymph node the upper extremities are nontender palpation.    Neurological: She is alert.  Clear speech.  Sensation grossly intact bilateral upper extremities.  Patient has 5 out of 5 symmetric grip strength.  She is able to perform okay sign, thumbs up, and cross second and third digits bilaterally.  Psychiatric: She has a normal mood and affect. Her behavior is normal. Thought content normal.  Nursing note and vitals reviewed.      ED Treatments / Results  Labs (all labs ordered are listed, but only abnormal results are displayed) Labs Reviewed - No data to display  EKG None  Radiology Dg Hand Complete Right  Result Date: 09/06/2017 CLINICAL DATA:  Redness and swelling. EXAM: RIGHT HAND - COMPLETE 3+ VIEW COMPARISON:  No recent. FINDINGS: No acute bony or joint abnormality. No evidence of fracture or dislocation. No radiopaque foreign bodies. IMPRESSION: No acute abnormality. Electronically Signed   By: Maisie Fus  Register   On: 09/06/2017 12:33    Procedures Procedures (including critical care time) EMERGENCY DEPARTMENT US SOFT TISSUE INTERPRETATION "Study: Limited Soft Tissue Ultrasound"  INDICATIONS: Pain and Soft tissue infection Multiple views of the body part were obtained in real-time with a multi-frequency linear probe  PERFORMED BY: Myself IMAGES ARCHIVED?: No SIDE:Right  BODY PART:R 3rd MCP joint area INTERPRETATION:  No obvious abscess    Medications Ordered in ED Medications - No data to display   Initial Impression / Assessment and Plan / ED Course    I have reviewed the triage vital signs and the nursing notes.  Pertinent labs & imaging results that were available during my care of the patient were reviewed by me and considered in my medical decision making (see chart for details).   Patient presents with right third knuckle pain/swelling/erythema associated subjective fever, chills, and right axillary lymphadenopathy in the setting of recent cat scratch. Patient initially tachycardic, resolved on repeat vitals and my exam, vitals otherwise WNL.  Patient's right third MCP is swollen, erythematous, tender to palpation.  The erythema does not spread in the lymphangatic pattern.  She does have a left axillary palpable lymph node.  Her ROM is intact, redness does not spread in tendon type pattern, therefore do not suspect tenosynovitis at this time. X-ray negative for fractures or dislocations.  No obvious  osteomyelitis.  Bedside ultrasound performed without appreciable abscess. Will start on Augmentin. I discussed results, treatment plan, need for PCP follow-up, and return precautions with the patient. Provided opportunity for questions, patient confirmed understanding and is in agreement with plan.   Findings and plan of care discussed with supervising physician Dr. Juleen ChinaKohut who personally evaluated and examined this patient, in agreement with plan.   Final Clinical Impressions(s) / ED Diagnoses   Final diagnoses:  Cat scratch of right hand with infection, initial encounter    ED Discharge Orders        Ordered    amoxicillin-clavulanate (AUGMENTIN) 875-125 MG tablet  2 times daily     09/06/17 [id number]       Artur Winningham, WeldonSamantha R, PA-C 09/06/17 1642    Raeford RazorKohut, Stephen, MD 09/08/17 [phone]

## 2017-09-06 NOTE — ED Triage Notes (Addendum)
Patient complaining of swelling, redness, and pain to right third finger knuckle x 4-5 days. Denies injury. Also complaining of pain to right shoulder and right axilla since yesterday.

## 2017-09-06 NOTE — Discharge Instructions (Signed)
You were  seen in the emergency department today for a infection to your right hand.  The x-ray did not show fractures or dislocations.  We suspect this infection was caused by cat scratch.  We are starting you on Augmentin, an antibiotic. Please take all of your antibiotics until finished. You may develop abdominal discomfort or diarrhea from the antibiotic.  You may help offset this with probiotics which you can buy at the store (ask your pharmacist if unable to find) or get probiotics in the form of eating yogurt. Do not eat or take the probiotics until 2 hours after your antibiotic. If you are unable to tolerate these side effects follow-up with your primary care provider or return to the emergency department.   If you begin to experience any blistering, rashes, swelling, or difficulty breathing seek medical care for evaluation of potentially more serious side effects.   Please be aware that this medication may interact with other medications you are taking, please be sure to discuss your medication list with your pharmacist.   Take Tylenol and/or Motrin per over-the-counter dosing for pain and swelling.  Would like you to follow-up with your primary care provider within 3 days for reevaluation.  Return to the ER for new or worsening symptoms including but not limited to worsening pain, worsening swelling, expanding redness, fever not improved with Motrin or Tylenol, vomiting, inability to move your hand joints., Or any other concerns.

## 2017-09-10 DIAGNOSIS — A281 Cat-scratch disease: Secondary | ICD-10-CM | POA: Diagnosis not present

## 2017-09-10 DIAGNOSIS — L089 Local infection of the skin and subcutaneous tissue, unspecified: Secondary | ICD-10-CM | POA: Diagnosis not present

## 2017-09-10 DIAGNOSIS — I10 Essential (primary) hypertension: Secondary | ICD-10-CM | POA: Diagnosis not present

## 2017-09-10 DIAGNOSIS — W5503XA Scratched by cat, initial encounter: Secondary | ICD-10-CM | POA: Diagnosis not present

## 2017-09-17 DIAGNOSIS — A281 Cat-scratch disease: Secondary | ICD-10-CM | POA: Diagnosis not present

## 2017-09-30 DIAGNOSIS — A281 Cat-scratch disease: Secondary | ICD-10-CM | POA: Diagnosis not present

## 2017-11-26 DIAGNOSIS — I10 Essential (primary) hypertension: Secondary | ICD-10-CM | POA: Diagnosis not present

## 2017-11-26 DIAGNOSIS — A281 Cat-scratch disease: Secondary | ICD-10-CM | POA: Diagnosis not present

## 2017-12-24 DIAGNOSIS — I10 Essential (primary) hypertension: Secondary | ICD-10-CM | POA: Diagnosis not present

## 2017-12-24 DIAGNOSIS — K580 Irritable bowel syndrome with diarrhea: Secondary | ICD-10-CM | POA: Diagnosis not present

## 2017-12-24 DIAGNOSIS — Z6379 Other stressful life events affecting family and household: Secondary | ICD-10-CM | POA: Diagnosis not present

## 2017-12-24 DIAGNOSIS — Z23 Encounter for immunization: Secondary | ICD-10-CM | POA: Diagnosis not present

## 2017-12-24 DIAGNOSIS — Z681 Body mass index (BMI) 19 or less, adult: Secondary | ICD-10-CM | POA: Diagnosis not present

## 2017-12-24 DIAGNOSIS — Z720 Tobacco use: Secondary | ICD-10-CM | POA: Diagnosis not present

## 2018-04-01 DIAGNOSIS — Z681 Body mass index (BMI) 19 or less, adult: Secondary | ICD-10-CM | POA: Diagnosis not present

## 2018-04-01 DIAGNOSIS — E785 Hyperlipidemia, unspecified: Secondary | ICD-10-CM | POA: Diagnosis not present

## 2018-04-01 DIAGNOSIS — I10 Essential (primary) hypertension: Secondary | ICD-10-CM | POA: Diagnosis not present

## 2018-05-15 IMAGING — NM NM MYOCAR MULTI W/SPECT W/WALL MOTION & EF
2 series · 12 of 12 positions shown · non-contrast
Comparison: none

[Series 1: rest · 8.28mm/px · 6 of 64 frames shown]
[frame 6/64]
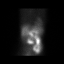
[frame 16/64]
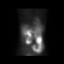
[frame 27/64]
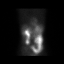
[frame 38/64]
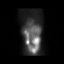
[frame 48/64]
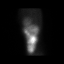
[frame 59/64]
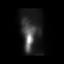

[Series 2: stress gated · 8.28mm/px · 6 of 64 frames shown]
[frame 6/64]
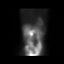
[frame 16/64]
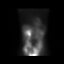
[frame 27/64]
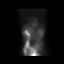
[frame 38/64]
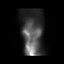
[frame 48/64]
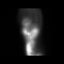
[frame 59/64]
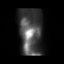

[12 of 12 positions shown; findings below may reference images not displayed]

Canned report from images found in remote index.

Refer to host system for actual result text.

## 2018-08-05 DIAGNOSIS — Z7189 Other specified counseling: Secondary | ICD-10-CM | POA: Diagnosis not present

## 2018-08-05 DIAGNOSIS — I10 Essential (primary) hypertension: Secondary | ICD-10-CM | POA: Diagnosis not present

## 2018-08-05 DIAGNOSIS — K589 Irritable bowel syndrome without diarrhea: Secondary | ICD-10-CM | POA: Diagnosis not present

## 2018-08-05 DIAGNOSIS — Z720 Tobacco use: Secondary | ICD-10-CM | POA: Diagnosis not present

## 2018-08-08 ENCOUNTER — Other Ambulatory Visit (HOSPITAL_COMMUNITY): Payer: Self-pay | Admitting: Family Medicine

## 2018-08-08 ENCOUNTER — Ambulatory Visit (HOSPITAL_COMMUNITY)
Admission: RE | Admit: 2018-08-08 | Discharge: 2018-08-08 | Disposition: A | Discharge status: Home / Self Care | Payer: PPO | Source: Ambulatory Visit | Attending: Family Medicine | Admitting: Family Medicine

## 2018-08-08 ENCOUNTER — Other Ambulatory Visit: Payer: Self-pay

## 2018-08-08 DIAGNOSIS — M5136 Other intervertebral disc degeneration, lumbar region: Secondary | ICD-10-CM | POA: Diagnosis not present

## 2018-08-08 DIAGNOSIS — M545 Low back pain, unspecified: Secondary | ICD-10-CM

## 2018-08-08 DIAGNOSIS — M5134 Other intervertebral disc degeneration, thoracic region: Secondary | ICD-10-CM | POA: Diagnosis not present

## 2019-02-24 ENCOUNTER — Other Ambulatory Visit: Payer: Self-pay | Admitting: Family Medicine

## 2019-02-24 DIAGNOSIS — Z1231 Encounter for screening mammogram for malignant neoplasm of breast: Secondary | ICD-10-CM

## 2019-05-11 DIAGNOSIS — Z1231 Encounter for screening mammogram for malignant neoplasm of breast: Secondary | ICD-10-CM | POA: Diagnosis not present

## 2019-06-02 DIAGNOSIS — F17219 Nicotine dependence, cigarettes, with unspecified nicotine-induced disorders: Secondary | ICD-10-CM | POA: Diagnosis not present

## 2019-06-02 DIAGNOSIS — E785 Hyperlipidemia, unspecified: Secondary | ICD-10-CM | POA: Diagnosis not present

## 2019-06-02 DIAGNOSIS — F411 Generalized anxiety disorder: Secondary | ICD-10-CM | POA: Diagnosis not present

## 2019-06-02 DIAGNOSIS — Z720 Tobacco use: Secondary | ICD-10-CM | POA: Diagnosis not present

## 2019-06-02 DIAGNOSIS — I10 Essential (primary) hypertension: Secondary | ICD-10-CM | POA: Diagnosis not present

## 2019-07-24 IMAGING — DX DG HAND COMPLETE 3+V*R*
3 series · 3 of 3 positions shown · non-contrast
Comparison: No recent.

CLINICAL DATA: Redness and swelling.

EXAM:
RIGHT HAND - COMPLETE 3+ VIEW

[hand pa]
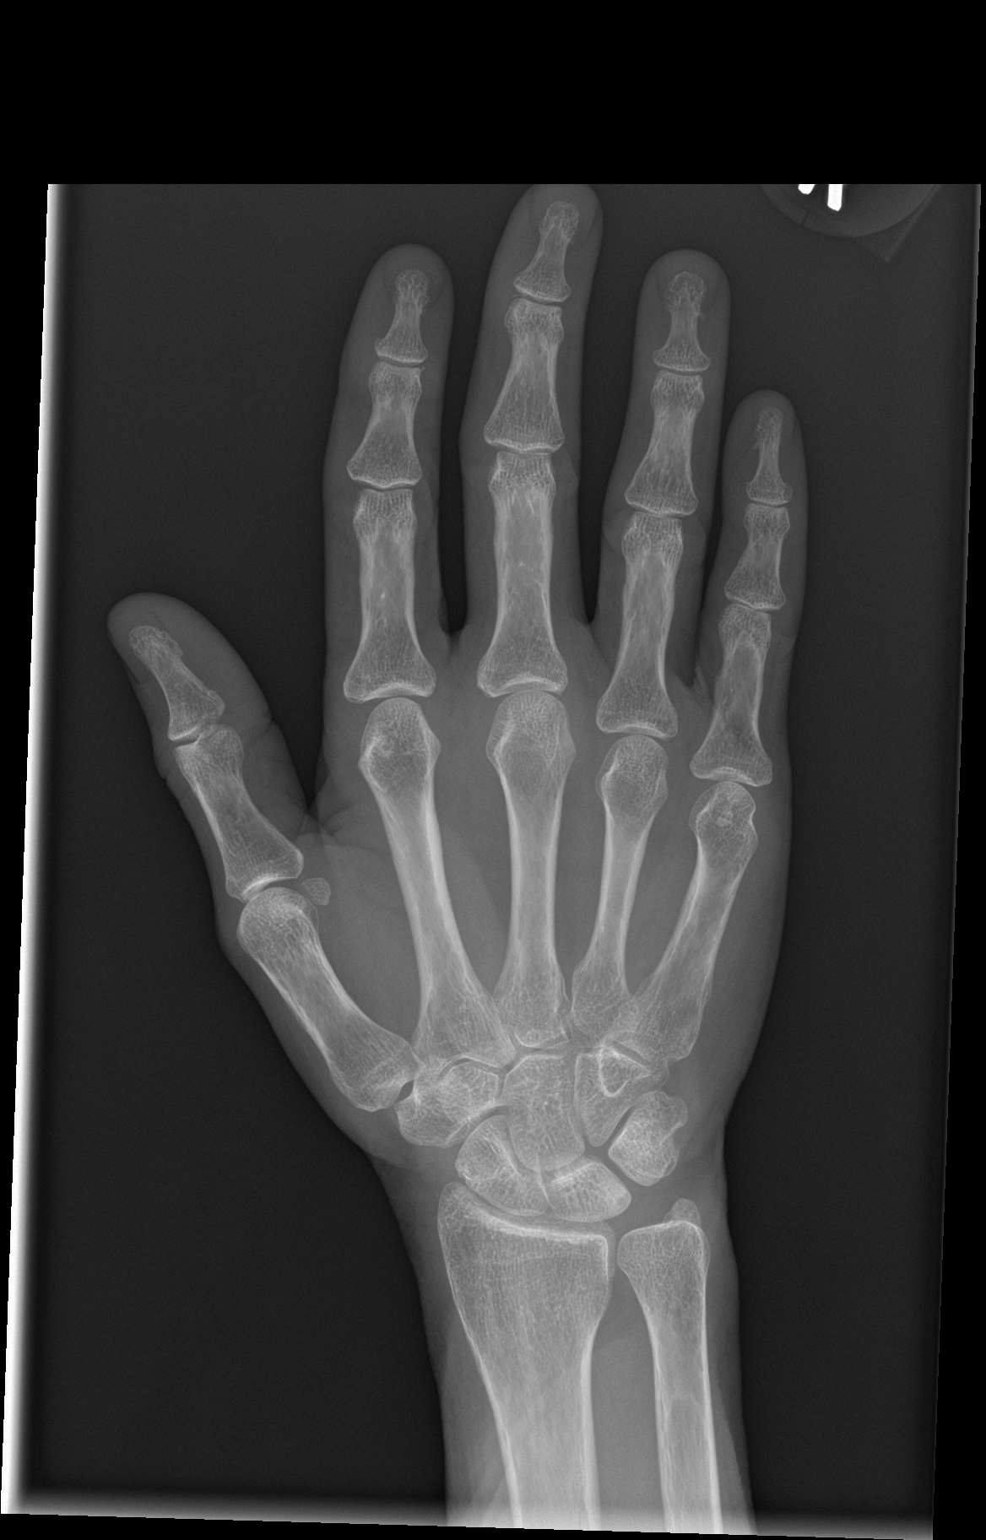

[hand obl]
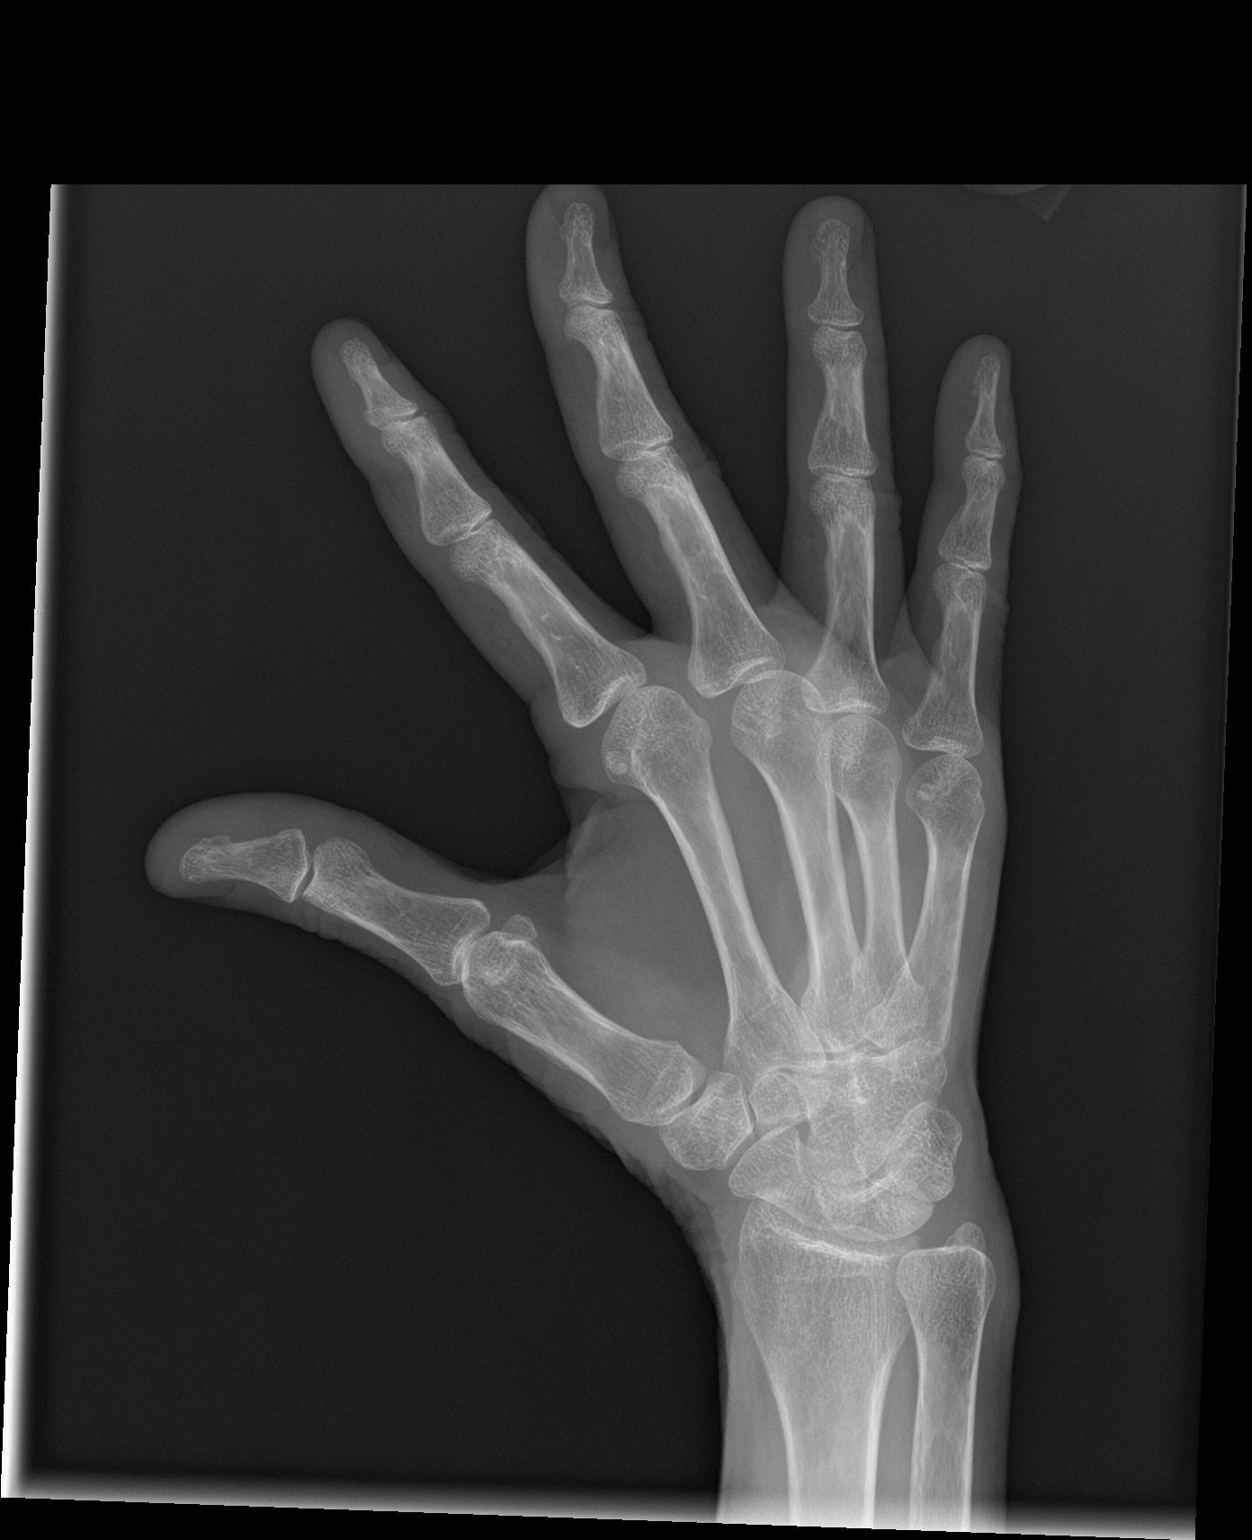

[hand lat]
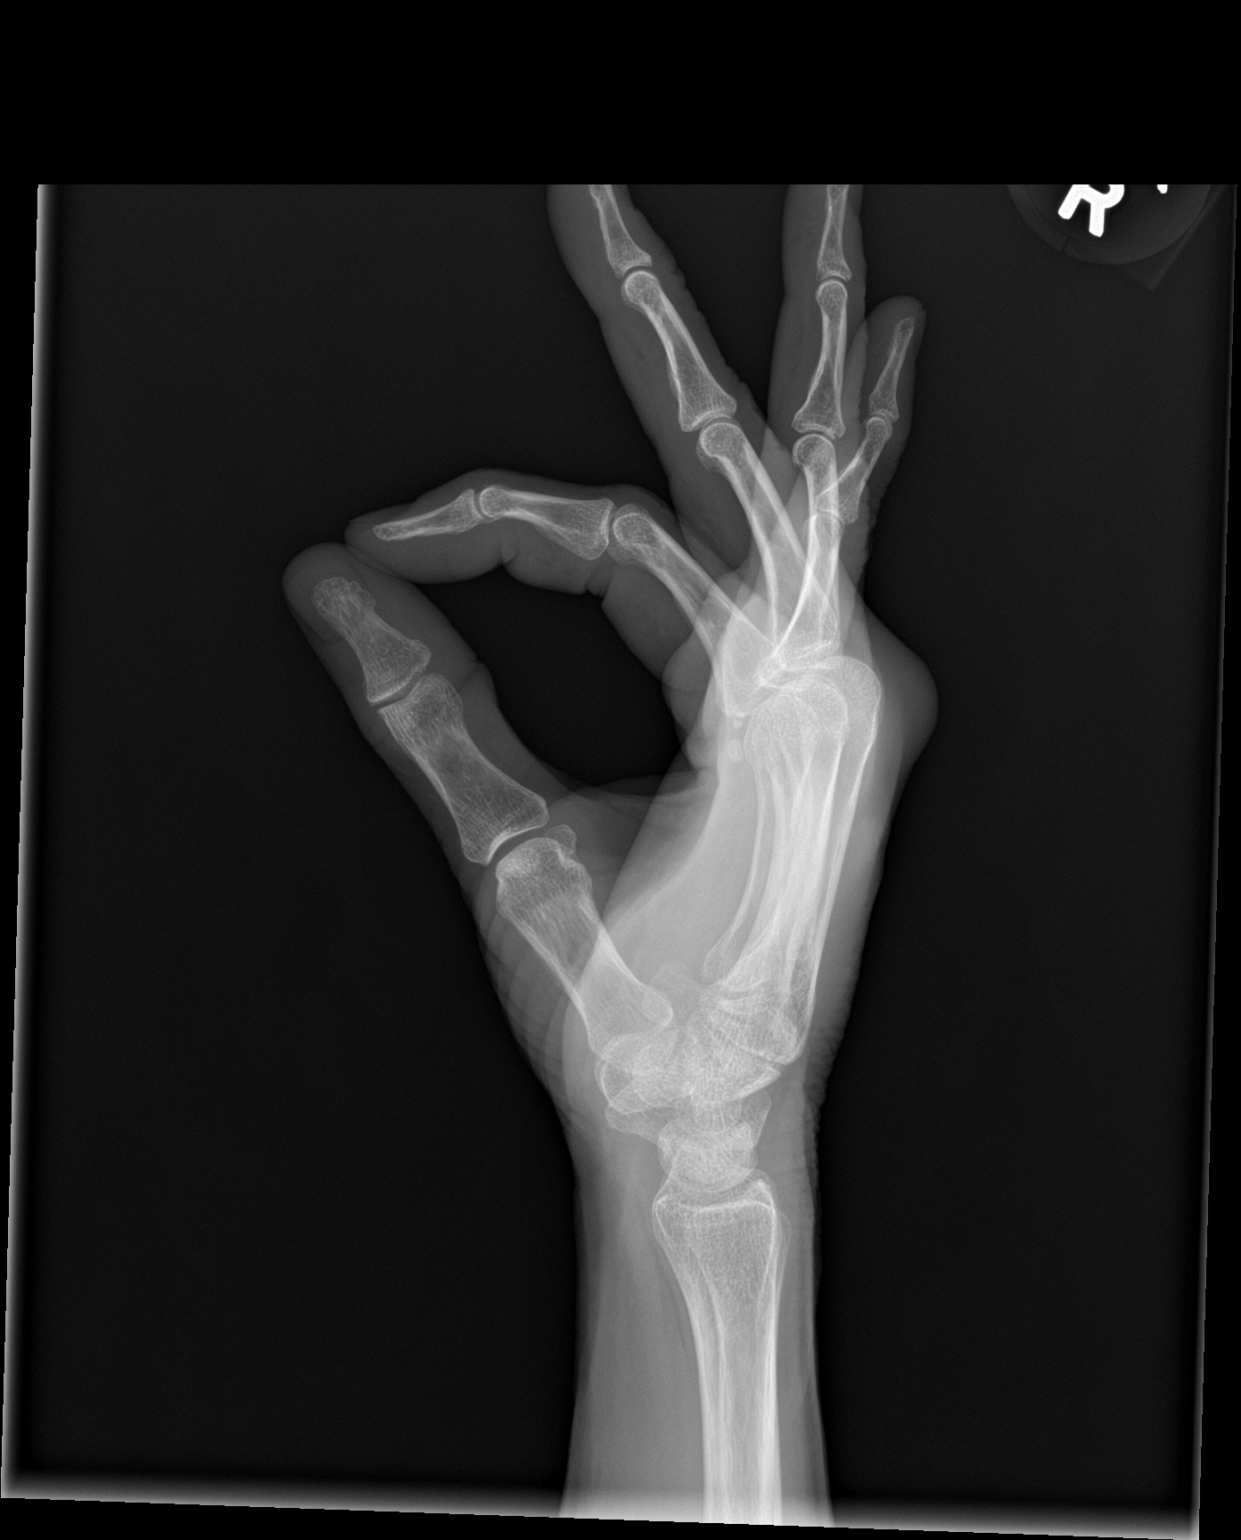

[3 of 3 positions shown; findings below may reference images not displayed]

FINDINGS: No acute bony or joint abnormality. No evidence of fracture or
dislocation. No radiopaque foreign bodies.
IMPRESSION: No acute abnormality.

## 2019-09-01 DIAGNOSIS — F411 Generalized anxiety disorder: Secondary | ICD-10-CM | POA: Diagnosis not present

## 2019-09-01 DIAGNOSIS — F5000 Anorexia nervosa, unspecified: Secondary | ICD-10-CM | POA: Diagnosis not present

## 2019-09-01 DIAGNOSIS — I10 Essential (primary) hypertension: Secondary | ICD-10-CM | POA: Diagnosis not present

## 2019-09-01 DIAGNOSIS — E785 Hyperlipidemia, unspecified: Secondary | ICD-10-CM | POA: Diagnosis not present

## 2019-12-29 DIAGNOSIS — Z720 Tobacco use: Secondary | ICD-10-CM | POA: Diagnosis not present

## 2019-12-29 DIAGNOSIS — F5000 Anorexia nervosa, unspecified: Secondary | ICD-10-CM | POA: Diagnosis not present

## 2019-12-29 DIAGNOSIS — E785 Hyperlipidemia, unspecified: Secondary | ICD-10-CM | POA: Diagnosis not present

## 2019-12-29 DIAGNOSIS — F411 Generalized anxiety disorder: Secondary | ICD-10-CM | POA: Diagnosis not present

## 2019-12-29 DIAGNOSIS — I10 Essential (primary) hypertension: Secondary | ICD-10-CM | POA: Diagnosis not present

## 2020-05-10 DIAGNOSIS — F5000 Anorexia nervosa, unspecified: Secondary | ICD-10-CM | POA: Diagnosis not present

## 2020-05-10 DIAGNOSIS — Z7189 Other specified counseling: Secondary | ICD-10-CM | POA: Diagnosis not present

## 2020-05-10 DIAGNOSIS — E785 Hyperlipidemia, unspecified: Secondary | ICD-10-CM | POA: Diagnosis not present

## 2020-05-10 DIAGNOSIS — Z720 Tobacco use: Secondary | ICD-10-CM | POA: Diagnosis not present

## 2020-05-10 DIAGNOSIS — F411 Generalized anxiety disorder: Secondary | ICD-10-CM | POA: Diagnosis not present

## 2020-05-10 DIAGNOSIS — I10 Essential (primary) hypertension: Secondary | ICD-10-CM | POA: Diagnosis not present

## 2020-09-06 DIAGNOSIS — Z681 Body mass index (BMI) 19 or less, adult: Secondary | ICD-10-CM | POA: Diagnosis not present

## 2020-09-06 DIAGNOSIS — F5000 Anorexia nervosa, unspecified: Secondary | ICD-10-CM | POA: Diagnosis not present

## 2020-09-06 DIAGNOSIS — F411 Generalized anxiety disorder: Secondary | ICD-10-CM | POA: Diagnosis not present

## 2020-09-06 DIAGNOSIS — Z720 Tobacco use: Secondary | ICD-10-CM | POA: Diagnosis not present

## 2020-09-06 DIAGNOSIS — I10 Essential (primary) hypertension: Secondary | ICD-10-CM | POA: Diagnosis not present

## 2020-09-06 DIAGNOSIS — E785 Hyperlipidemia, unspecified: Secondary | ICD-10-CM | POA: Diagnosis not present

## 2021-01-10 DIAGNOSIS — I10 Essential (primary) hypertension: Secondary | ICD-10-CM | POA: Diagnosis not present

## 2021-01-10 DIAGNOSIS — F5000 Anorexia nervosa, unspecified: Secondary | ICD-10-CM | POA: Diagnosis not present

## 2021-01-10 DIAGNOSIS — Z681 Body mass index (BMI) 19 or less, adult: Secondary | ICD-10-CM | POA: Diagnosis not present

## 2021-01-10 DIAGNOSIS — F411 Generalized anxiety disorder: Secondary | ICD-10-CM | POA: Diagnosis not present

## 2021-01-10 DIAGNOSIS — Z720 Tobacco use: Secondary | ICD-10-CM | POA: Diagnosis not present

## 2021-04-18 DIAGNOSIS — Z8616 Personal history of COVID-19: Secondary | ICD-10-CM | POA: Diagnosis not present

## 2021-04-18 DIAGNOSIS — Z720 Tobacco use: Secondary | ICD-10-CM | POA: Diagnosis not present

## 2021-04-18 DIAGNOSIS — Z28311 Partially vaccinated for covid-19: Secondary | ICD-10-CM | POA: Diagnosis not present

## 2021-04-18 DIAGNOSIS — I10 Essential (primary) hypertension: Secondary | ICD-10-CM | POA: Diagnosis not present

## 2021-04-18 DIAGNOSIS — Z20822 Contact with and (suspected) exposure to covid-19: Secondary | ICD-10-CM | POA: Diagnosis not present

## 2021-04-18 DIAGNOSIS — E785 Hyperlipidemia, unspecified: Secondary | ICD-10-CM | POA: Diagnosis not present

## 2021-08-15 DIAGNOSIS — E785 Hyperlipidemia, unspecified: Secondary | ICD-10-CM | POA: Diagnosis not present

## 2021-08-15 DIAGNOSIS — I10 Essential (primary) hypertension: Secondary | ICD-10-CM | POA: Diagnosis not present

## 2021-08-15 DIAGNOSIS — M810 Age-related osteoporosis without current pathological fracture: Secondary | ICD-10-CM | POA: Diagnosis not present

## 2021-08-15 DIAGNOSIS — Z634 Disappearance and death of family member: Secondary | ICD-10-CM | POA: Diagnosis not present

## 2021-08-15 DIAGNOSIS — Z720 Tobacco use: Secondary | ICD-10-CM | POA: Diagnosis not present

## 2021-08-15 DIAGNOSIS — F5000 Anorexia nervosa, unspecified: Secondary | ICD-10-CM | POA: Diagnosis not present

## 2021-09-19 DIAGNOSIS — F5000 Anorexia nervosa, unspecified: Secondary | ICD-10-CM | POA: Diagnosis not present

## 2021-09-19 DIAGNOSIS — E785 Hyperlipidemia, unspecified: Secondary | ICD-10-CM | POA: Diagnosis not present

## 2021-09-19 DIAGNOSIS — M810 Age-related osteoporosis without current pathological fracture: Secondary | ICD-10-CM | POA: Diagnosis not present

## 2021-09-19 DIAGNOSIS — I10 Essential (primary) hypertension: Secondary | ICD-10-CM | POA: Diagnosis not present

## 2022-01-16 DIAGNOSIS — E785 Hyperlipidemia, unspecified: Secondary | ICD-10-CM | POA: Diagnosis not present

## 2022-01-16 DIAGNOSIS — Z720 Tobacco use: Secondary | ICD-10-CM | POA: Diagnosis not present

## 2022-01-16 DIAGNOSIS — M810 Age-related osteoporosis without current pathological fracture: Secondary | ICD-10-CM | POA: Diagnosis not present

## 2022-01-16 DIAGNOSIS — F5000 Anorexia nervosa, unspecified: Secondary | ICD-10-CM | POA: Diagnosis not present

## 2022-01-16 DIAGNOSIS — K589 Irritable bowel syndrome without diarrhea: Secondary | ICD-10-CM | POA: Diagnosis not present

## 2022-01-16 DIAGNOSIS — K219 Gastro-esophageal reflux disease without esophagitis: Secondary | ICD-10-CM | POA: Diagnosis not present

## 2022-01-16 DIAGNOSIS — Z23 Encounter for immunization: Secondary | ICD-10-CM | POA: Diagnosis not present

## 2022-01-16 DIAGNOSIS — I10 Essential (primary) hypertension: Secondary | ICD-10-CM | POA: Diagnosis not present

## 2022-01-31 ENCOUNTER — Other Ambulatory Visit (HOSPITAL_COMMUNITY): Payer: Self-pay | Admitting: Family Medicine

## 2022-01-31 DIAGNOSIS — F17200 Nicotine dependence, unspecified, uncomplicated: Secondary | ICD-10-CM

## 2022-01-31 DIAGNOSIS — M545 Low back pain, unspecified: Secondary | ICD-10-CM

## 2022-02-13 DIAGNOSIS — I10 Essential (primary) hypertension: Secondary | ICD-10-CM | POA: Diagnosis not present

## 2022-02-13 DIAGNOSIS — F5000 Anorexia nervosa, unspecified: Secondary | ICD-10-CM | POA: Diagnosis not present

## 2022-02-13 DIAGNOSIS — E785 Hyperlipidemia, unspecified: Secondary | ICD-10-CM | POA: Diagnosis not present

## 2022-02-13 DIAGNOSIS — Z720 Tobacco use: Secondary | ICD-10-CM | POA: Diagnosis not present

## 2022-02-13 DIAGNOSIS — K589 Irritable bowel syndrome without diarrhea: Secondary | ICD-10-CM | POA: Diagnosis not present

## 2022-02-13 DIAGNOSIS — M810 Age-related osteoporosis without current pathological fracture: Secondary | ICD-10-CM | POA: Diagnosis not present

## 2022-02-13 DIAGNOSIS — K219 Gastro-esophageal reflux disease without esophagitis: Secondary | ICD-10-CM | POA: Diagnosis not present

## 2022-02-23 ENCOUNTER — Ambulatory Visit (HOSPITAL_COMMUNITY)
Admission: RE | Admit: 2022-02-23 | Discharge: 2022-02-23 | Disposition: A | Discharge status: Home / Self Care | Payer: PPO | Source: Ambulatory Visit | Attending: Family Medicine | Admitting: Family Medicine

## 2022-02-23 DIAGNOSIS — I2510 Atherosclerotic heart disease of native coronary artery without angina pectoris: Secondary | ICD-10-CM | POA: Diagnosis not present

## 2022-02-23 DIAGNOSIS — M545 Low back pain, unspecified: Secondary | ICD-10-CM | POA: Insufficient documentation

## 2022-02-23 DIAGNOSIS — Z122 Encounter for screening for malignant neoplasm of respiratory organs: Secondary | ICD-10-CM | POA: Insufficient documentation

## 2022-02-23 DIAGNOSIS — J439 Emphysema, unspecified: Secondary | ICD-10-CM | POA: Diagnosis not present

## 2022-02-23 DIAGNOSIS — M549 Dorsalgia, unspecified: Secondary | ICD-10-CM | POA: Diagnosis not present

## 2022-02-23 DIAGNOSIS — I700 Atherosclerosis of aorta: Secondary | ICD-10-CM | POA: Diagnosis not present

## 2022-02-23 DIAGNOSIS — F17210 Nicotine dependence, cigarettes, uncomplicated: Secondary | ICD-10-CM | POA: Insufficient documentation

## 2022-02-23 DIAGNOSIS — F17200 Nicotine dependence, unspecified, uncomplicated: Secondary | ICD-10-CM

## 2022-04-10 DIAGNOSIS — I119 Hypertensive heart disease without heart failure: Secondary | ICD-10-CM | POA: Diagnosis not present

## 2022-04-10 DIAGNOSIS — I2510 Atherosclerotic heart disease of native coronary artery without angina pectoris: Secondary | ICD-10-CM | POA: Diagnosis not present

## 2022-04-10 DIAGNOSIS — K589 Irritable bowel syndrome without diarrhea: Secondary | ICD-10-CM | POA: Diagnosis not present

## 2022-04-10 DIAGNOSIS — J439 Emphysema, unspecified: Secondary | ICD-10-CM | POA: Diagnosis not present

## 2022-04-10 DIAGNOSIS — I10 Essential (primary) hypertension: Secondary | ICD-10-CM | POA: Diagnosis not present

## 2022-04-10 DIAGNOSIS — Z720 Tobacco use: Secondary | ICD-10-CM | POA: Diagnosis not present

## 2022-04-10 DIAGNOSIS — K2900 Acute gastritis without bleeding: Secondary | ICD-10-CM | POA: Diagnosis not present

## 2022-05-28 ENCOUNTER — Encounter: Payer: Self-pay | Admitting: *Deleted

## 2022-05-28 ENCOUNTER — Ambulatory Visit: Payer: PPO | Attending: Internal Medicine | Admitting: Internal Medicine

## 2022-05-28 ENCOUNTER — Encounter: Payer: Self-pay | Admitting: Internal Medicine

## 2022-05-28 VITALS — BP 128/70 | HR 75 | Ht 62.0 in | Wt 99.0 lb

## 2022-05-28 DIAGNOSIS — I10 Essential (primary) hypertension: Secondary | ICD-10-CM | POA: Diagnosis not present

## 2022-05-28 DIAGNOSIS — R079 Chest pain, unspecified: Secondary | ICD-10-CM | POA: Diagnosis not present

## 2022-05-28 DIAGNOSIS — Z136 Encounter for screening for cardiovascular disorders: Secondary | ICD-10-CM | POA: Diagnosis not present

## 2022-05-28 DIAGNOSIS — Z720 Tobacco use: Secondary | ICD-10-CM | POA: Insufficient documentation

## 2022-05-28 DIAGNOSIS — E7849 Other hyperlipidemia: Secondary | ICD-10-CM | POA: Diagnosis not present

## 2022-05-28 DIAGNOSIS — I739 Peripheral vascular disease, unspecified: Secondary | ICD-10-CM

## 2022-05-28 DIAGNOSIS — E785 Hyperlipidemia, unspecified: Secondary | ICD-10-CM | POA: Insufficient documentation

## 2022-05-28 MED ORDER — NITROGLYCERIN 0.4 MG SL SUBL: 0.4000 mg | SUBLINGUAL_TABLET | SUBLINGUAL | 3 refills | Status: AC | PRN

## 2022-05-28 NOTE — Progress Notes (Addendum)
Cardiology Office Note  Date: 05/28/2022   ID: [name], DOB 10/27/1960, MRN [id number]  PCP:  Iona Beard, MD  Cardiologist:  None Electrophysiologist:  None   Reason for Office Visit: Evaluation chest pain at the request of Dr. Berdine Addison   History of Present Illness: [name] is a 62 y.o. Kaufman known to have HTN, nicotine abuse was referred to cardiology clinic for evaluation of chest pain.  Patient was initially referred to cardiology clinic in 2018 for chest pain when she underwent Echocardiogram that showed normal LVEF and no valve abnormalities, NM stress test showed no evidence of ischemia but EKG portion showed horizontal ST segment depressions of 1 mm noted during stress in the V4 V5 and V6 leads.  NM stress test was low risk study as there was no ischemia on the nuclear scan. Patient was scheduled to return back in 6 months but was lost to follow-up. She was referred back to cardiology clinic for evaluation of chest pains. Patient reported having substernal chest pain lasting for few minutes, occurs with exertion and resolves with rest. Frequency has been 2-3 times per month. Patient does not recall the accurate chest pain history from 2018 and if it has been progressing. Denies SOB, palpitations, syncope. She reports bilateral leg heaviness that started around the [place] vaccination, 2021-22 and thinks this could be from [place]. She noticed her leg heaviness with walking on level ground and more noticeable especially with walking uphill. She smokes cigarettes, sometimes 1 pack/day and sometimes 5 cigarettes/day. She lost her sister last year and is grieving.  Past Medical History:  Diagnosis Date   Chest pain    Gastro-esophageal reflux disease without esophagitis    Generalized anxiety disorder    High cholesterol    Hypertension    OCD (obsessive compulsive disorder)    Osteoporosis     Past Surgical History:  Procedure Laterality Date   CHOLECYSTECTOMY      OOPHORECTOMY     TUBAL LIGATION      Current Outpatient Medications  Medication Sig Dispense Refill   amLODipine-benazepril (LOTREL) 5-10 MG capsule Take 1 capsule by mouth daily.     atorvastatin (LIPITOR) 10 MG tablet Take 10 mg by mouth at bedtime.     diazepam (VALIUM) 2 MG tablet Take 2 mg by mouth 2 (two) times daily.     meloxicam (MOBIC) 15 MG tablet Take 15 mg by mouth daily.     nitroGLYCERIN (NITROSTAT) 0.4 MG SL tablet Place 1 tablet (0.4 mg total) under the tongue every 5 (five) minutes as needed for chest pain. 25 tablet 3   omeprazole (PRILOSEC) 20 MG capsule Take 20 mg by mouth daily.     aspirin EC 81 MG tablet Take 81 mg by mouth daily. (Patient not taking: Reported on 05/28/2022)     No current facility-administered medications for this visit.   Allergies:  Sulfonamide derivatives   Social History: The patient  reports that she has been smoking cigarettes. She started smoking about 45 years ago. She has been smoking an average of 1.5 packs per day. She has never used smokeless tobacco. She reports that she does not currently use alcohol. She reports that she does not use drugs.   Family History: The patient's family history includes Aneurysm in her father; Colon cancer in her father; Diabetes in her mother; Heart attack in her maternal aunt and mother; Heart disease in her maternal aunt and mother; Hypertension in her father, maternal aunt,  and mother; Kidney cancer in her father.   ROS:  Please see the history of present illness. Otherwise, complete review of systems is positive for none.  All other systems are reviewed and negative.   Physical Exam: VS:  BP 128/70   Pulse 75   Ht '5\' 2"'$  (1.575 m)   Wt 99 lb (44.9 kg)   SpO2 99%   BMI 18.11 kg/m , BMI Body mass index is 18.11 kg/m.  Wt Readings from Last 3 Encounters:  05/28/22 99 lb (44.9 kg)  09/06/17 97 lb (44 kg)  11/19/16 97 lb 3.2 oz (44.1 kg)    General: Patient appears comfortable at rest. HEENT:  Conjunctiva and lids normal, oropharynx clear with moist mucosa. Neck: Supple, no elevated JVP or carotid bruits, no thyromegaly. Lungs: Clear to auscultation, nonlabored breathing at rest. Cardiac: Regular rate and rhythm, no S3 or significant systolic murmur, no pericardial rub. Abdomen: Soft, nontender, no hepatomegaly, bowel sounds present, no guarding or rebound. Extremities: No pitting edema Skin: Warm and dry. Musculoskeletal: No kyphosis. Neuropsychiatric: Alert and oriented x3, affect grossly appropriate.  ECG:  An ECG dated 05/28/2022 was personally reviewed today and demonstrated:  Normal sinus rhythm, nonspecific T wave changes in V1 and V2.  Recent Labwork: No results found for requested labs within last 365 days.  No results found for: "CHOL", "TRIG", "HDL", "CHOLHDL", "VLDL", "LDLCALC", "LDLDIRECT"  Other Studies Reviewed Today: NM stress test in 2018 Positive ECG with 1 mm ST depression prior to lexiscan in lateral leads However perfusion images are normal with no ischemia or infarction EF 60%   Echocardiogram in 2018 Normal LVEF No valve abnormalities  Assessment and Plan: Patient is a 62 year old F known to have HTN, nicotine abuse was referred to cardiology clinic for evaluation chest pain.  # Cardiac chest pain -Patient reported having substernal chest pain lasting for few minutes, occurs with exertion and resolves with rest. Frequency has been 2-3 times per month. Patient does not recall the accurate chest pain history from 2018 and if it has been worsening. Instructed to keep a log of the chest pains and bring it to the next clinic visit. Obtain 2D echocardiogram and Lexiscan.  # Bilateral lower extremity claudication, rule out PAD -Patient has bilateral lower extremity leg heaviness on level ground but more noticeable walking uphill. Ongoing since 2021. -Obtain ABI with USG arterial Doppler lower extremities -Will start the patient on aspirin 81 mg once daily  and high intensity statin once we have more definitive evidence of PAD  # Nicotine abuse -Smoking cessation counseling provided, motivated to cut down smoking cigarettes. Smoking cessation instruction/counseling given:  counseled patient on the dangers of tobacco use, advised patient to stop smoking, and reviewed strategies to maximize success   # HTN, controlled -Continue amlodipine-benazepril 5-10 mg once daily -Management of HTN per PCP  I have spent a total of 45 minutes with patient reviewing chart, EKGs, labs and examining patient as well as establishing an assessment and plan that was discussed with the patient.  > 50% of time was spent in direct patient care.     Medication Adjustments/Labs and Tests Ordered: Current medicines are reviewed at length with the patient today.  Concerns regarding medicines are outlined above.   Tests Ordered: Orders Placed This Encounter  Procedures   NM Myocar Multi W/Spect W/Wall Motion / EF   EKG 12-Lead   ECHOCARDIOGRAM COMPLETE   VAS [place] ABI WITH/WO TBI     Medication Changes: No orders of  the defined types were placed in this encounter.   Disposition:  Follow up  6 months  Signed Marlaya Turck Fidel Levy, MD, 05/28/2022 10:53 AM    [facility], [place], [place] [address]

## 2022-05-28 NOTE — Patient Instructions (Addendum)
Medication Instructions:  Your physician recommends that you continue on your current medications as directed. Please refer to the Current Medication list given to you today.  Labwork: none  Testing/Procedures: Your physician has requested that you have an echocardiogram. Echocardiography is a painless test that uses sound waves to create images of your heart. It provides your doctor with information about the size and shape of your heart and how well your heart's chambers and valves are working. This procedure takes approximately one hour. There are no restrictions for this procedure. Please do NOT wear cologne, perfume, aftershave, or lotions (deodorant is allowed). Please arrive 15 minutes prior to your appointment time. Your physician has requested that you have an ankle brachial index (ABI). During this test an ultrasound and blood pressure cuff are used to evaluate the arteries that supply the arms and legs with blood. Allow thirty minutes for this exam. There are no restrictions or special instructions. Your physician has requested that you have a lexiscan myoview. For further information please visit [email or web address]. Please follow instruction sheet, as given.  Follow-Up: Your physician recommends that you schedule a follow-up appointment in: 6 months  Any Other Special Instructions Will Be Listed Below (If Applicable).  If you need a refill on your cardiac medications before your next appointment, please call your pharmacy.

## 2022-05-30 ENCOUNTER — Other Ambulatory Visit: Payer: Self-pay | Admitting: Internal Medicine

## 2022-05-30 DIAGNOSIS — I739 Peripheral vascular disease, unspecified: Secondary | ICD-10-CM

## 2022-06-12 DIAGNOSIS — I2510 Atherosclerotic heart disease of native coronary artery without angina pectoris: Secondary | ICD-10-CM | POA: Diagnosis not present

## 2022-06-12 DIAGNOSIS — Z720 Tobacco use: Secondary | ICD-10-CM | POA: Diagnosis not present

## 2022-06-12 DIAGNOSIS — I10 Essential (primary) hypertension: Secondary | ICD-10-CM | POA: Diagnosis not present

## 2022-06-12 DIAGNOSIS — F5000 Anorexia nervosa, unspecified: Secondary | ICD-10-CM | POA: Diagnosis not present

## 2022-06-12 DIAGNOSIS — K219 Gastro-esophageal reflux disease without esophagitis: Secondary | ICD-10-CM | POA: Diagnosis not present

## 2022-06-12 DIAGNOSIS — J439 Emphysema, unspecified: Secondary | ICD-10-CM | POA: Diagnosis not present

## 2022-06-18 ENCOUNTER — Ambulatory Visit: Payer: PPO | Attending: Internal Medicine

## 2022-06-18 ENCOUNTER — Ambulatory Visit (INDEPENDENT_AMBULATORY_CARE_PROVIDER_SITE_OTHER): Payer: PPO

## 2022-06-18 DIAGNOSIS — I739 Peripheral vascular disease, unspecified: Secondary | ICD-10-CM | POA: Diagnosis not present

## 2022-06-18 DIAGNOSIS — R079 Chest pain, unspecified: Secondary | ICD-10-CM

## 2022-06-19 LAB — ECHOCARDIOGRAM COMPLETE
AR max vel: 1.5 cm2
AV Peak grad: 7 mmHg
Ao pk vel: 1.33 m/s
Area-P 1/2: 4.1 cm2
Calc EF: 61.4 %
MV M vel: 1.88 m/s
MV Peak grad: 14.1 mmHg
S' Lateral: 3.2 cm
Single Plane A2C EF: 61 %
Single Plane A4C EF: 58.9 %

## 2022-06-19 LAB — VAS US ABI WITH/WO TBI
Left ABI: 1.03
Right ABI: 1.02

## 2022-06-22 ENCOUNTER — Encounter (HOSPITAL_COMMUNITY)
Admission: RE | Admit: 2022-06-22 | Discharge: 2022-06-22 | Disposition: A | Discharge status: Home / Self Care | Payer: PPO | Source: Ambulatory Visit | Attending: Internal Medicine | Admitting: Internal Medicine

## 2022-06-22 ENCOUNTER — Ambulatory Visit (HOSPITAL_COMMUNITY)
Admission: RE | Admit: 2022-06-22 | Discharge: 2022-06-22 | Disposition: A | Discharge status: Home / Self Care | Payer: PPO | Source: Ambulatory Visit | Attending: Internal Medicine | Admitting: Internal Medicine

## 2022-06-22 DIAGNOSIS — R079 Chest pain, unspecified: Secondary | ICD-10-CM | POA: Insufficient documentation

## 2022-06-22 LAB — NM MYOCAR MULTI W/SPECT W/WALL MOTION / EF
LV dias vol: 52 mL (ref 46–106)
LV sys vol: 16 mL
Nuc Stress EF: 69 %
Peak HR: 92 {beats}/min
RATE: 0.3
Rest HR: 54 {beats}/min
Rest Nuclear Isotope Dose: 10.2 mCi
SDS: 2
SRS: 1
SSS: 3
ST Depression (mm): 0 mm
Stress Nuclear Isotope Dose: 29.9 mCi
TID: 1.21

## 2022-06-22 MED ORDER — TECHNETIUM TC 99M TETROFOSMIN IV KIT
10.2000 | PACK | Freq: Once | INTRAVENOUS | Status: AC | PRN
  Administered 2022-06-22: 10.2 via INTRAVENOUS

## 2022-06-22 MED ORDER — SODIUM CHLORIDE FLUSH 0.9 % IV SOLN
INTRAVENOUS | Status: AC
  Administered 2022-06-22: 10 mL via INTRAVENOUS
  Filled 2022-06-22: qty 10

## 2022-06-22 MED ORDER — REGADENOSON 0.4 MG/5ML IV SOLN
INTRAVENOUS | Status: AC
  Administered 2022-06-22: 0.4 mg via INTRAVENOUS
  Filled 2022-06-22: qty 5

## 2022-06-22 MED ORDER — TECHNETIUM TC 99M TETROFOSMIN IV KIT
29.9000 | PACK | Freq: Once | INTRAVENOUS | Status: AC | PRN
  Administered 2022-06-22: 29.9 via INTRAVENOUS

## 2022-06-25 ENCOUNTER — Telehealth: Payer: Self-pay | Admitting: Internal Medicine

## 2022-06-25 NOTE — Telephone Encounter (Signed)
Patient is returning call to discuss echo results. °

## 2022-06-25 NOTE — Telephone Encounter (Signed)
Patient made aware via results note. 

## 2022-06-29 ENCOUNTER — Telehealth: Payer: Self-pay | Admitting: Internal Medicine

## 2022-06-29 NOTE — Telephone Encounter (Signed)
patient is returning phone call regarding results

## 2022-06-29 NOTE — Telephone Encounter (Signed)
Patient made aware of results, sent to pcp.

## 2022-07-19 ENCOUNTER — Other Ambulatory Visit (HOSPITAL_COMMUNITY): Payer: Self-pay | Admitting: Family Medicine

## 2022-07-19 DIAGNOSIS — R911 Solitary pulmonary nodule: Secondary | ICD-10-CM

## 2022-08-01 ENCOUNTER — Ambulatory Visit (HOSPITAL_COMMUNITY): Admission: RE | Admit: 2022-08-01 | Payer: PPO | Source: Ambulatory Visit

## 2022-08-22 ENCOUNTER — Ambulatory Visit (HOSPITAL_COMMUNITY)
Admission: RE | Admit: 2022-08-22 | Discharge: 2022-08-22 | Disposition: A | Discharge status: Home / Self Care | Payer: PPO | Source: Ambulatory Visit | Attending: Family Medicine | Admitting: Family Medicine

## 2022-08-22 DIAGNOSIS — R911 Solitary pulmonary nodule: Secondary | ICD-10-CM | POA: Insufficient documentation

## 2022-08-22 DIAGNOSIS — F17210 Nicotine dependence, cigarettes, uncomplicated: Secondary | ICD-10-CM | POA: Diagnosis not present

## 2022-08-27 ENCOUNTER — Encounter (HOSPITAL_COMMUNITY): Payer: PPO

## 2022-09-10 ENCOUNTER — Encounter (HOSPITAL_COMMUNITY): Payer: PPO

## 2022-10-16 DIAGNOSIS — M180 Bilateral primary osteoarthritis of first carpometacarpal joints: Secondary | ICD-10-CM | POA: Diagnosis not present

## 2022-10-16 DIAGNOSIS — E785 Hyperlipidemia, unspecified: Secondary | ICD-10-CM | POA: Diagnosis not present

## 2022-10-16 DIAGNOSIS — Z720 Tobacco use: Secondary | ICD-10-CM | POA: Diagnosis not present

## 2022-10-16 DIAGNOSIS — I10 Essential (primary) hypertension: Secondary | ICD-10-CM | POA: Diagnosis not present

## 2022-10-16 DIAGNOSIS — I2510 Atherosclerotic heart disease of native coronary artery without angina pectoris: Secondary | ICD-10-CM | POA: Diagnosis not present

## 2022-10-16 DIAGNOSIS — K219 Gastro-esophageal reflux disease without esophagitis: Secondary | ICD-10-CM | POA: Diagnosis not present

## 2022-10-16 DIAGNOSIS — J439 Emphysema, unspecified: Secondary | ICD-10-CM | POA: Diagnosis not present

## 2022-10-16 DIAGNOSIS — E7800 Pure hypercholesterolemia, unspecified: Secondary | ICD-10-CM | POA: Diagnosis not present

## 2022-10-16 DIAGNOSIS — M810 Age-related osteoporosis without current pathological fracture: Secondary | ICD-10-CM | POA: Diagnosis not present

## 2022-10-16 DIAGNOSIS — F5000 Anorexia nervosa, unspecified: Secondary | ICD-10-CM | POA: Diagnosis not present

## 2022-12-17 ENCOUNTER — Ambulatory Visit: Payer: PPO | Admitting: Internal Medicine

## 2023-02-19 DIAGNOSIS — Z0000 Encounter for general adult medical examination without abnormal findings: Secondary | ICD-10-CM | POA: Diagnosis not present

## 2023-02-19 DIAGNOSIS — I700 Atherosclerosis of aorta: Secondary | ICD-10-CM | POA: Diagnosis not present

## 2023-02-19 DIAGNOSIS — I119 Hypertensive heart disease without heart failure: Secondary | ICD-10-CM | POA: Diagnosis not present

## 2023-02-19 DIAGNOSIS — Z720 Tobacco use: Secondary | ICD-10-CM | POA: Diagnosis not present

## 2023-02-19 DIAGNOSIS — F5000 Anorexia nervosa, unspecified: Secondary | ICD-10-CM | POA: Diagnosis not present

## 2023-02-19 DIAGNOSIS — Z23 Encounter for immunization: Secondary | ICD-10-CM | POA: Diagnosis not present

## 2023-02-19 DIAGNOSIS — J432 Centrilobular emphysema: Secondary | ICD-10-CM | POA: Diagnosis not present

## 2023-02-19 DIAGNOSIS — J439 Emphysema, unspecified: Secondary | ICD-10-CM | POA: Diagnosis not present

## 2023-02-19 DIAGNOSIS — I10 Essential (primary) hypertension: Secondary | ICD-10-CM | POA: Diagnosis not present

## 2023-02-19 DIAGNOSIS — I2510 Atherosclerotic heart disease of native coronary artery without angina pectoris: Secondary | ICD-10-CM | POA: Diagnosis not present

## 2023-02-19 DIAGNOSIS — E7800 Pure hypercholesterolemia, unspecified: Secondary | ICD-10-CM | POA: Diagnosis not present

## 2023-06-18 DIAGNOSIS — K219 Gastro-esophageal reflux disease without esophagitis: Secondary | ICD-10-CM | POA: Diagnosis not present

## 2023-06-18 DIAGNOSIS — J439 Emphysema, unspecified: Secondary | ICD-10-CM | POA: Diagnosis not present

## 2023-06-18 DIAGNOSIS — I2510 Atherosclerotic heart disease of native coronary artery without angina pectoris: Secondary | ICD-10-CM | POA: Diagnosis not present

## 2023-06-18 DIAGNOSIS — K589 Irritable bowel syndrome without diarrhea: Secondary | ICD-10-CM | POA: Diagnosis not present

## 2023-06-18 DIAGNOSIS — Z720 Tobacco use: Secondary | ICD-10-CM | POA: Diagnosis not present

## 2023-06-18 DIAGNOSIS — I119 Hypertensive heart disease without heart failure: Secondary | ICD-10-CM | POA: Diagnosis not present

## 2023-06-18 DIAGNOSIS — E7800 Pure hypercholesterolemia, unspecified: Secondary | ICD-10-CM | POA: Diagnosis not present

## 2023-06-18 DIAGNOSIS — I700 Atherosclerosis of aorta: Secondary | ICD-10-CM | POA: Diagnosis not present

## 2023-06-18 DIAGNOSIS — F5000 Anorexia nervosa, unspecified: Secondary | ICD-10-CM | POA: Diagnosis not present

## 2023-06-18 DIAGNOSIS — I10 Essential (primary) hypertension: Secondary | ICD-10-CM | POA: Diagnosis not present

## 2023-09-16 DIAGNOSIS — I2510 Atherosclerotic heart disease of native coronary artery without angina pectoris: Secondary | ICD-10-CM | POA: Diagnosis not present

## 2023-09-16 DIAGNOSIS — E7800 Pure hypercholesterolemia, unspecified: Secondary | ICD-10-CM | POA: Diagnosis not present

## 2023-09-16 DIAGNOSIS — I700 Atherosclerosis of aorta: Secondary | ICD-10-CM | POA: Diagnosis not present

## 2023-09-16 DIAGNOSIS — K219 Gastro-esophageal reflux disease without esophagitis: Secondary | ICD-10-CM | POA: Diagnosis not present

## 2023-09-16 DIAGNOSIS — F5000 Anorexia nervosa, unspecified: Secondary | ICD-10-CM | POA: Diagnosis not present

## 2023-09-16 DIAGNOSIS — I119 Hypertensive heart disease without heart failure: Secondary | ICD-10-CM | POA: Diagnosis not present

## 2023-09-16 DIAGNOSIS — J439 Emphysema, unspecified: Secondary | ICD-10-CM | POA: Diagnosis not present

## 2023-09-16 DIAGNOSIS — Z720 Tobacco use: Secondary | ICD-10-CM | POA: Diagnosis not present

## 2023-09-17 ENCOUNTER — Other Ambulatory Visit (HOSPITAL_COMMUNITY): Payer: Self-pay | Admitting: Family Medicine

## 2023-09-17 DIAGNOSIS — Z720 Tobacco use: Secondary | ICD-10-CM

## 2023-09-21 ENCOUNTER — Ambulatory Visit (HOSPITAL_COMMUNITY)
Admission: RE | Admit: 2023-09-21 | Discharge: 2023-09-21 | Disposition: A | Discharge status: Home / Self Care | Source: Ambulatory Visit | Attending: Family Medicine | Admitting: Family Medicine

## 2023-09-21 DIAGNOSIS — F17210 Nicotine dependence, cigarettes, uncomplicated: Secondary | ICD-10-CM | POA: Diagnosis not present

## 2023-09-21 DIAGNOSIS — Z122 Encounter for screening for malignant neoplasm of respiratory organs: Secondary | ICD-10-CM | POA: Insufficient documentation

## 2023-09-21 DIAGNOSIS — J439 Emphysema, unspecified: Secondary | ICD-10-CM | POA: Diagnosis not present

## 2023-09-21 DIAGNOSIS — I700 Atherosclerosis of aorta: Secondary | ICD-10-CM | POA: Insufficient documentation

## 2023-09-21 DIAGNOSIS — Z720 Tobacco use: Secondary | ICD-10-CM

## 2023-12-17 DIAGNOSIS — K219 Gastro-esophageal reflux disease without esophagitis: Secondary | ICD-10-CM | POA: Diagnosis not present

## 2023-12-17 DIAGNOSIS — I119 Hypertensive heart disease without heart failure: Secondary | ICD-10-CM | POA: Diagnosis not present

## 2023-12-17 DIAGNOSIS — Z23 Encounter for immunization: Secondary | ICD-10-CM | POA: Diagnosis not present

## 2023-12-17 DIAGNOSIS — J439 Emphysema, unspecified: Secondary | ICD-10-CM | POA: Diagnosis not present

## 2023-12-17 DIAGNOSIS — F5000 Anorexia nervosa, unspecified: Secondary | ICD-10-CM | POA: Diagnosis not present

## 2023-12-17 DIAGNOSIS — E7800 Pure hypercholesterolemia, unspecified: Secondary | ICD-10-CM | POA: Diagnosis not present

## 2023-12-17 DIAGNOSIS — I10 Essential (primary) hypertension: Secondary | ICD-10-CM | POA: Diagnosis not present

## 2023-12-17 DIAGNOSIS — I2510 Atherosclerotic heart disease of native coronary artery without angina pectoris: Secondary | ICD-10-CM | POA: Diagnosis not present

## 2023-12-17 DIAGNOSIS — Z720 Tobacco use: Secondary | ICD-10-CM | POA: Diagnosis not present
